# Patient Record
Sex: Male | Born: 1945
Health system: Southern US, Community
[De-identification: ages and names within clinical notes are randomized; demographics above are authoritative.]

## PROBLEM LIST (undated history)

## (undated) DIAGNOSIS — K219 Gastro-esophageal reflux disease without esophagitis: Secondary | ICD-10-CM

## (undated) DIAGNOSIS — I10 Essential (primary) hypertension: Secondary | ICD-10-CM

## (undated) DIAGNOSIS — E7800 Pure hypercholesterolemia, unspecified: Secondary | ICD-10-CM

## (undated) DIAGNOSIS — M1990 Unspecified osteoarthritis, unspecified site: Secondary | ICD-10-CM

## (undated) DIAGNOSIS — I2510 Atherosclerotic heart disease of native coronary artery without angina pectoris: Secondary | ICD-10-CM

## (undated) DIAGNOSIS — B029 Zoster without complications: Secondary | ICD-10-CM

## (undated) DIAGNOSIS — I999 Unspecified disorder of circulatory system: Secondary | ICD-10-CM

## (undated) DIAGNOSIS — M4310 Spondylolisthesis, site unspecified: Secondary | ICD-10-CM

## (undated) HISTORY — DX: Spondylolisthesis, site unspecified: M43.10

## (undated) HISTORY — PX: FOOT SURGERY: SHX648

## (undated) HISTORY — DX: Zoster without complications: B02.9

## (undated) HISTORY — PX: CARDIAC CATHETERIZATION: SHX172

## (undated) HISTORY — DX: Unspecified disorder of circulatory system: I99.9

## (undated) HISTORY — PX: WISDOM TOOTH EXTRACTION: SHX21

## (undated) HISTORY — DX: Unspecified osteoarthritis, unspecified site: M19.90

## (undated) HISTORY — DX: Gastro-esophageal reflux disease without esophagitis: K21.9

## (undated) HISTORY — DX: Atherosclerotic heart disease of native coronary artery without angina pectoris: I25.10

---

## 1998-03-15 ENCOUNTER — Ambulatory Visit (HOSPITAL_COMMUNITY): Admission: EM | Admit: 1998-03-15 | Discharge: 1998-03-16 | Payer: Self-pay | Admitting: Emergency Medicine

## 1998-03-31 ENCOUNTER — Encounter: Payer: Self-pay | Admitting: Gastroenterology

## 1998-03-31 ENCOUNTER — Ambulatory Visit (HOSPITAL_COMMUNITY): Admission: RE | Admit: 1998-03-31 | Discharge: 1998-03-31 | Payer: Self-pay | Admitting: Gastroenterology

## 2000-11-27 ENCOUNTER — Ambulatory Visit (HOSPITAL_COMMUNITY): Admission: RE | Admit: 2000-11-27 | Discharge: 2000-11-27 | Payer: Self-pay | Admitting: Gastroenterology

## 2000-11-27 ENCOUNTER — Encounter (INDEPENDENT_AMBULATORY_CARE_PROVIDER_SITE_OTHER): Payer: Self-pay

## 2008-04-10 ENCOUNTER — Inpatient Hospital Stay (HOSPITAL_COMMUNITY): Admission: EM | Admit: 2008-04-10 | Discharge: 2008-04-11 | Payer: Self-pay | Admitting: Interventional Cardiology

## 2008-04-10 ENCOUNTER — Ambulatory Visit: Payer: Self-pay | Admitting: Diagnostic Radiology

## 2008-04-17 ENCOUNTER — Ambulatory Visit (HOSPITAL_COMMUNITY): Admission: RE | Admit: 2008-04-17 | Discharge: 2008-04-18 | Payer: Self-pay | Admitting: Interventional Cardiology

## 2008-05-09 ENCOUNTER — Encounter (HOSPITAL_COMMUNITY): Admission: RE | Admit: 2008-05-09 | Discharge: 2008-08-07 | Payer: Self-pay | Admitting: Interventional Cardiology

## 2008-08-08 ENCOUNTER — Encounter (HOSPITAL_COMMUNITY): Admission: RE | Admit: 2008-08-08 | Discharge: 2008-09-06 | Payer: Self-pay | Admitting: Interventional Cardiology

## 2009-01-16 ENCOUNTER — Encounter: Admission: RE | Admit: 2009-01-16 | Discharge: 2009-01-16 | Payer: Self-pay | Admitting: Family Medicine

## 2010-05-30 ENCOUNTER — Encounter: Payer: Self-pay | Admitting: Emergency Medicine

## 2010-05-31 ENCOUNTER — Encounter: Payer: Self-pay | Admitting: Family Medicine

## 2010-09-21 NOTE — Discharge Summary (Signed)
NAME:  Chad Hopkins, Chad Hopkins NO.:  [id number]   MEDICAL RECORD NO.:  [id number]          PATIENT TYPE:  INP   LOCATION:  2916                         FACILITY:  [facility]   PHYSICIAN:  Lyn Records, M.D.   DATE OF BIRTH:  09-Feb-1946   DATE OF ADMISSION:  04/10/2008  DATE OF DISCHARGE:  04/11/2008                               DISCHARGE SUMMARY   DISCHARGE DIAGNOSES:  1. Non-ST-segment elevated myocardial infarction, status post drug-      eluting stent to the right coronary artery.  2. Residual coronary artery disease of the circumflex, percutaneous      coronary intervention in the near future.  3. Hypertension.  4. Dyslipidemia.  5. Family history of coronary artery disease.   HOSPITAL COURSE:  Chad Hopkins is a 65 year old male patient with no known  history of coronary artery disease, who began having substernal chest  pain the night before admission.  This went away, but came back on the  date of the admission.  He went to [facility], and he was  found to have a new right bundle-branch block.  There was a question of  minimal ST-segment elevation in the inferior leads with ST-segment  depression in aVL.  The patient initially had chest pain, was placed on  IV nitroglycerin, and was pain free by the time he arrived to the cath  lab.   During the catheterization, the patient was found to have a subtotal  right coronary artery, and a drug-eluting stent was implanted to this  area.  He was also found to have a 90% mid circumflex lesion.  The  culprit lesion was felt to be at the right coronary artery, a drug-  eluting stent implanted.  Our next step is to have a stage procedure and  intervene on the circumflex sometime next week.   His maximum troponin was 2.23.  Sodium 133, BUN 19, and creatinine 1.1.  White count of 14.7, reactive; hemoglobin 14.3; hematocrit 42.7; and  platelets 169.   The patient is being discharged today pain-free, stable, and in  improved  condition.   DISCHARGE MEDICATIONS:  1. Plavix 75 mg daily.  2. Enteric-coated aspirin 325 mg daily.  3. Lisinopril 5 mg daily.  4. Lopressor 12.5 mg twice a day.  5. Pravastatin 80 mg daily.  6. Zantac as prior to admission.  7. Sublingual nitroglycerin p.r.n., chest pain.  8. Fish oil 1000 mg 2 tablets daily.   We did try to talk the patient into taking another statin/Crestor, but  he refused taking that.  He felt safer on pravastatin, as he had done  research.   He is to remain on a low-sodium heart-healthy diet.  Increase activity  slowly as per cardiac rehabilitation.  No driving for 2 days.  No  lifting over 10 pounds for 1 week.  Clean cath site gently with soap and  water, no scrubbing.  The office will call him with his appointment for  his cardioversion.  At that time, we will have a CMET and a lipid panel  drawn to look at his  lipid status.      Guy Franco, P.A.      Lyn Records, M.D.  Electronically Signed    LB/MEDQ  D:  04/11/2008  T:  04/12/2008  Job:  [id number]   cc:   Caryn Bee L. Little, M.D.

## 2010-09-21 NOTE — Cardiovascular Report (Signed)
NAME:  Chad Hopkins, OLIVERA NO.:  [id number]   MEDICAL RECORD NO.:  [id number]          PATIENT TYPE:  OIB   LOCATION:  6527                         FACILITY:  [facility]   PHYSICIAN:  Lyn Records, M.D.   DATE OF BIRTH:  April 01, 1946   DATE OF PROCEDURE:  04/17/2008  DATE OF DISCHARGE:                            CARDIAC CATHETERIZATION   INDICATION:  Residual high-grade mid circumflex disease identified at  the time of PCI of acute right coronary lesion 1 week ago.   PROCEDURES PERFORMED:  1. Left heart catheterization.  2. Selective coronary angiography.  3. Circumflex percutaneous coronary intervention with drug-eluting      stent overlapping stents in the mid circumflex.   DESCRIPTION:  After informed consent, a 6-French sheath was placed.  A  diagnostic right coronary catheter was used to document patency in the  right coronary.  We then performed angiography of the left coronary  selectively engaging the LAD and circumflex sequentially.   After documenting that it was safe to proceed, we performed PCI on the  circumflex.  The patient received aliquots of Versed and fentanyl 12.5  mcg respectively x3.  We used a [place] guidewire to cross the  stenosis.  Ultimate guide catheter was a 6-French #4 CLS catheter.  We  then performed angioplasty using a 12 mm long x 2.5 mm apex.  We then  deployed a 28 mm long x 3.0 mm diameter XIENCE V stent to 12  atmospheres.  We postdilated this region with a 3.25 x 20 mm long  Voyager [place].  We then also performed postdilatation with a 3.5 x 20 [place]  Voyager in the proximal two thirds of the stent to 15 atmospheres.  Postangio images demonstrated a distal margin dissection.  We did  overlapping of this distal stent margin with a 2.75 x 8 mm long XIENCE V  stent to 14 atmospheres.  High-pressure postdilatation with a separate  catheter was not performed.  Two balloon inflations were performed.  Final result was excellent with TIMI  grade 3 flow noted.   Angio-Seal was used for hemostasis.   CONCLUSION:  1. Widely patent right coronary artery stent.  2. Successful overlapping stents in the mid circumflex with reduction      in 80-85% stenosis to 0% with TIMI grade 3 flow.   PLAN:  Continue Plavix for at least 2 years.  Clinical followup, risk  factor modification, hopefully discharge, April 18, 2008.      Lyn Records, M.D.  Electronically Signed     HWS/MEDQ  D:  04/17/2008  T:  04/17/2008  Job:  [id number]   cc:   Caryn Bee L. Little, M.D.

## 2010-09-21 NOTE — Cardiovascular Report (Signed)
NAME:  Chad Hopkins, Chad Hopkins NO.:  [id number]   MEDICAL RECORD NO.:  [id number]          PATIENT TYPE:  INP   LOCATION:  2916                         FACILITY:  [facility]   PHYSICIAN:  Lyn Records, M.D.   DATE OF BIRTH:  04-Aug-1945   DATE OF PROCEDURE:  04/10/2008  DATE OF DISCHARGE:                            CARDIAC CATHETERIZATION   INDICATION:  Acute coronary syndrome, developing within the last 24  hours.   PROCEDURES PERFORMED:  1. Left heart catheterization.  2. Selective coronary angiography.  3. Left ventriculography.  4. Drug-eluting stent implantation in the mid right coronary.   DESCRIPTION:  The patient was brought straight to the cath lab from the  [facility] in [facility].  He was having chest pain  when he presented but upon loading into the ambulance and during the  transport, the discomfort resolved.  This is identical to what happened  the previous evening.  Each episode of discomfort lasted approximately 1  hour.  He is pain-free at the time of arrival in the cath lab.  His EKG  suggests acute injury involving the inferior wall with prominent  hyperacute T waves inferiorly.   After discussing his clinical situation and describing the procedure and  attendant risks, we proceeded with coronary angiography.   A 6-French sheath was placed in the right femoral artery following local  anesthesia with 1% Xylocaine.  1 mg of Versed and 25 mcg of fentanyl  were used initially for sedation.  A 6-French A2 multipurpose catheter  was used for hemodynamic recordings and right coronary angiography.  We  also did a hand injection into the left ventricle.  A #4 left Judkins  catheter was used for left coronary angiography.  After reviewing the  digital images, it was felt that the right coronary was a culprit lesion  for the patient's symptoms over the past 24 hours, and we decided to  perform PCI.   A 6-French Judkins right guide catheter  was used.  The patient was given  a bolus followed by an infusion of Angiomax (1.75 mcg/kg per hour  infusion preceded by 0.75 mcg/kg IV bolus).  We used an [employer]  guidewire to cross the lesion in the right coronary.  Predilatation was  performed with 2.5 Apex balloon.  We then positioned and deployed an 18  mm long x 3.0 mm Promus stent to 12 atmospheres.  Two balloon inflations  were performed.  We then placed and inflated a 15 mm long x 3.5 mm  Quantum balloon.  A single balloon inflation to 14 atmospheres was  performed.  Followup angiography demonstrated an adequate result.  The  case was terminated.  Manual compression was used for hemostasis.  No  complications occurred.   RESULTS:  1. Hemodynamic data:      a.     Aortic pressure 111/56.      b.     Left ventricular pressure 116/8.  2. Left ventriculography:  LV cavity size and function are normal.  EF      was 60%.  3. Coronary angiography:  There is calcification in the LAD and left      main.      a.     Left main coronary:  Widely patent but probably a double       ostial origin of the left the coronary vessels were very short       left main.      b.     Left anterior descending coronary:  The LAD is moderately       diseased.  Calcification is noted.  Segmental 50% obstruction is       noted in the proximal vessel.  The first diagonal contains 80-90%       focal proximal obstruction.  The ostium of the second diagonal       contains a 60% stenosis.  The LAD wraps around the left       ventricular apex.      c.     Circumflex artery:  The circumflex coronary artery contains       a mid vessel segmental 85-90% obstruction.  One obtuse marginal       arises distally supplying the inferolateral wall.      d.     Right coronary:  The right coronary is heavily diseased.       The entire mid segment is diseased.  The distal portion of the mid       segment contains a segmental 99% obstruction with TIMI grade 2        flow noted beyond this point.  A large PDA and 2 left ventricular       branches arise from the distal right coronary.  4. PCI:  The mid right coronary 99% lesion was reduced to 0% with TIMI      grade 3 flow after focal stenting of the distal most portion of the      segmental disease in the mid right coronary.  TIMI grade 3 flow was      reestablished.   CONCLUSIONS:  1. Acute coronary syndrome secondary to plaque rupture and high-grade      stenosis in the mid right coronary.  2. Successful drug-eluting stent implantation in the distal portion of      the mid right coronary from 99%-0% with restoration of TIMI grade 3      flow.  3. Moderate mid right coronary artery disease with 50-70% stenosis      noted in the proximal portion of the midvessel.  The circumflex      contains an 85-90% segmental mid vessel stenosis, the proximal left      anterior descending contains 50% stenosis.  The first diagonal was      90% obstructed.  The second diagonal contains 60% obstruction.  4. Normal left ventricular function.   PLAN:  1. Aspirin and Plavix for greater than 1 year.  2. Consider stenting of the circumflex in the next 7-10 days.  We will      probably treat the first diagonal medically, although this will be      dependent upon the patient's clinical course.      Lyn Records, M.D.  Electronically Signed     HWS/MEDQ  D:  04/10/2008  T:  04/11/2008  Job:  [id number]   cc:   Caryn Bee L. Little, M.D.

## 2010-09-21 NOTE — H&P (Signed)
NAME:  Chad Hopkins, Chad Hopkins NO.:  [id number]   MEDICAL RECORD NO.:  [id number]          PATIENT TYPE:  OIB   LOCATION:  2899                         FACILITY:  [facility]   PHYSICIAN:  Lyn Records, M.D.   DATE OF BIRTH:  1945/08/30   DATE OF ADMISSION:  04/10/2008  DATE OF DISCHARGE:                              HISTORY & PHYSICAL   CHIEF COMPLAINT:  Chest pain.   HISTORY OF PRESENT ILLNESS:  Chad Hopkins is a 65 year old male patient  with no known coronary artery disease who began having substernal chest  pain last night.  It spontaneously went away.  The pain came back around  8 a.m. today and he called his primary care physician's office, who  recommended that he proceed on to the emergency room.  He went to the  outlying [place] [facility] and was found to have a new  right bundle branch block with minimal ST segment elevation in the  inferior leads with ST depression in aVL.  The patient was started on IV  nitroglycerin and, by the time he was transferred to [facility] to  the cath lab, he was pain free.   PAST MEDICAL HISTORY:  Hypertension, hyperlipidemia.   FAMILY HISTORY:  Coronary artery disease.   SOCIAL HISTORY:  Lives in [place] with his wife.  Denies any tobacco,  alcohol, or illicit drug use.   FAMILY HISTORY:  Mom had coronary artery disease.   ALLERGIES:  No known drug allergies.   MEDICATIONS:  Lisinopril, Zantac, and pravastatin.   REVIEW OF SYSTEMS:  Chest pain, as above.  Otherwise, negative.   PHYSICAL EXAM:  VITAL SIGNS:  Pulse 68, respirations 12, blood pressure  136/86, O2 saturation 100% on 2L.  GENERAL:  He is in no acute distress.  HEENT:  Grossly normal.  No carotid or subclavian bruits.  No JVD.  No  thyromegaly.  Sclerae clear.  Conjunctivae normal.  Nares without  drainage.  CHEST:  Clear to auscultation bilaterally.  No wheeze, rales, or  rhonchi.  HEART:  Regular rate and rhythm.  No murmur.  ABDOMEN:  No  hepatomegaly.  Abdomen nontender, nondistended.  No mass.  No bruits.  EXTREMITIES:  No peripheral edema.  No femoral bruit.  Lower extremity,  no lower extremity edema.  Possible DP pulses bilaterally.  NEUROLOGIC:  Cranial nerves 2-12 grossly intact.  Normal mood and  affect.   DIAGNOSTIC STUDIES:  Chest x-ray shows no active disease.  Lab studies  show a hemoglobin of 15.8 and hematocrit 47.7, white count 12.9,  platelets 196,000.  PT 12, INR 0.9, point of care markers negative x1.   EKG shows new right bundle branch block with minimal ST segment  elevation in the inferior leads with peaked T waves in the inferior  leads.  There is ST segment depression in the aVL.   ASSESSMENT AND PLAN:  1. Acute coronary syndrome.  2. Hyperlipidemia.  3. Hypertension.  4. Family history of coronary artery disease.   The patient has been seen and examined by Dr. Verdis Prime.  At this  time, with  the patient's acute coronary syndrome and unstable symptoms,  will proceed with emergent cardiac catheterization.      Guy Franco, P.A.      Lyn Records, M.D.  Electronically Signed    LB/MEDQ  D:  04/10/2008  T:  04/10/2008  Job:  [id number]   cc:   Caryn Bee L. Little, M.D.

## 2010-09-24 NOTE — Procedures (Signed)
[facility]  Patient:    Chad Hopkins, Chad Hopkins               MRN: [id number] Proc. Date: 11/27/00 Adm. Date:  [id number] Attending:  Rich Brave CC:         Al Decant. Janey Greaser, M.D.   Procedure Report  PROCEDURE:  Colonoscopy with polypectomy.  INDICATION:  A 65 year old gentleman for colon cancer screening.  FINDINGS:  Small 3-4 mm polyp, snared in the mid colon or left colon. Scattered rare diverticulosis.  DESCRIPTION OF PROCEDURE:  The patient provided written consent for the procedure.  Sedation was fentanyl 75 mcg and Versed 9 mg IV without arrhythmias or desaturation.  Digital exam of the prostate was unremarkable, and Olympus adjustable-tension pediatric video colonoscope was advanced without significant difficulty to the cecum, as identified by visualization of the appendiceal orifice and the absence of further lumen.  Some external abdominal compression helped to control looping.  Pullback was then performed.  The quality of the prep was very good, and it is felt that all areas were adequately seen.  There was a 3-4 mm semipedunculated polyp removed by snare technique from what I believe was the left colon, possibly the transverse colon.  It was retrieved by suctioning through the scope.  There was a rare diverticulum in the transverse colon.  No large polyps, cancer, colitis, vascular malformations, or extensive diverticular were noted.  Retroflexion in the rectum was unremarkable.  Pull-out through the anal canal demonstrated perhaps some small internal hemorrhoids, nothing impressive.  The patient tolerated this procedure well, and there were no apparent complications.  IMPRESSION: 1. Diminutive colonic polyp. 2. Minimal diverticulosis.  PLAN:  Await pathology on the polyp.  Possible follow-up colonoscopy in five years if the polyp is adenomatous in character. DD:  11/27/00 TD:  11/25/00 Job:  [id number]

## 2011-02-11 LAB — CBC
HCT: 47.7 % (ref 39.0–52.0)
Hemoglobin: 14.3 g/dL (ref 13.0–17.0)
Hemoglobin: 15.8 g/dL (ref 13.0–17.0)
MCHC: 33.3 g/dL (ref 30.0–36.0)
MCHC: 33.4 g/dL (ref 30.0–36.0)
MCV: 91.9 fL (ref 78.0–100.0)
MCV: 93.6 fL (ref 78.0–100.0)
Platelets: 162 10*3/uL (ref 150–400)
Platelets: 196 10*3/uL (ref 150–400)
RBC: 4.53 MIL/uL (ref 4.22–5.81)
RBC: 4.56 MIL/uL (ref 4.22–5.81)
RBC: 5.18 MIL/uL (ref 4.22–5.81)
RDW: 13.1 % (ref 11.5–15.5)
WBC: 12.9 10*3/uL — ABNORMAL HIGH (ref 4.0–10.5)
WBC: 14.7 10*3/uL — ABNORMAL HIGH (ref 4.0–10.5)
WBC: 9.4 10*3/uL (ref 4.0–10.5)

## 2011-02-11 LAB — DIFFERENTIAL
Basophils Absolute: 0.1 10*3/uL (ref 0.0–0.1)
Basophils Relative: 1 % (ref 0–1)
Eosinophils Absolute: 0.3 10*3/uL (ref 0.0–0.7)
Eosinophils Relative: 3 % (ref 0–5)
Lymphocytes Relative: 45 % (ref 12–46)
Lymphs Abs: 5.9 10*3/uL — ABNORMAL HIGH (ref 0.7–4.0)
Monocytes Absolute: 0.7 10*3/uL (ref 0.1–1.0)
Monocytes Relative: 5 % (ref 3–12)
Neutro Abs: 5.9 10*3/uL (ref 1.7–7.7)
Neutrophils Relative %: 46 % (ref 43–77)

## 2011-02-11 LAB — PROTIME-INR
INR: 0.9 (ref 0.00–1.49)
Prothrombin Time: 12 seconds (ref 11.6–15.2)

## 2011-02-11 LAB — CK TOTAL AND CKMB (NOT AT ARMC)
Relative Index: 7.8 — ABNORMAL HIGH (ref 0.0–2.5)
Total CK: 125 U/L (ref 7–232)

## 2011-02-11 LAB — POCT I-STAT, CHEM 8
Calcium, Ion: 1.27 mmol/L (ref 1.12–1.32)
Creatinine, Ser: 1.1 mg/dL (ref 0.4–1.5)
Glucose, Bld: 107 mg/dL — ABNORMAL HIGH (ref 70–99)
Hemoglobin: 14.3 g/dL (ref 13.0–17.0)
Sodium: 133 mEq/L — ABNORMAL LOW (ref 135–145)
TCO2: 24 mmol/L (ref 0–100)

## 2011-02-11 LAB — POCT CARDIAC MARKERS: Myoglobin, poc: 70.4 ng/mL (ref 12–200)

## 2011-02-11 LAB — BASIC METABOLIC PANEL
BUN: 14 mg/dL (ref 6–23)
CO2: 29 mEq/L (ref 19–32)
Calcium: 8.8 mg/dL (ref 8.4–10.5)
Chloride: 102 mEq/L (ref 96–112)
Creatinine, Ser: 1.09 mg/dL (ref 0.4–1.5)
Creatinine, Ser: 1.18 mg/dL (ref 0.4–1.5)
GFR calc Af Amer: >60 mL/min (ref >60)
GFR calc non Af Amer: >60 mL/min (ref >60)
Glucose, Bld: 111 mg/dL — ABNORMAL HIGH (ref 70–99)
Potassium: 3.9 mEq/L (ref 3.5–5.1)

## 2011-02-11 LAB — APTT: aPTT: 25 seconds (ref 24–37)

## 2011-02-11 LAB — TROPONIN I: Troponin I: 1.77 ng/mL (ref 0.00–0.06)

## 2011-05-20 ENCOUNTER — Encounter (HOSPITAL_COMMUNITY): Payer: Self-pay | Admitting: *Deleted

## 2011-05-20 ENCOUNTER — Emergency Department (HOSPITAL_COMMUNITY)
Admission: EM | Admit: 2011-05-20 | Discharge: 2011-05-20 | Disposition: A | Discharge status: Home / Self Care | Payer: Medicare Other | Attending: Emergency Medicine | Admitting: Emergency Medicine

## 2011-05-20 DIAGNOSIS — I10 Essential (primary) hypertension: Secondary | ICD-10-CM | POA: Diagnosis not present

## 2011-05-20 DIAGNOSIS — Z79899 Other long term (current) drug therapy: Secondary | ICD-10-CM | POA: Diagnosis not present

## 2011-05-20 DIAGNOSIS — E789 Disorder of lipoprotein metabolism, unspecified: Secondary | ICD-10-CM | POA: Diagnosis not present

## 2011-05-20 DIAGNOSIS — K5289 Other specified noninfective gastroenteritis and colitis: Secondary | ICD-10-CM | POA: Insufficient documentation

## 2011-05-20 DIAGNOSIS — R112 Nausea with vomiting, unspecified: Secondary | ICD-10-CM | POA: Diagnosis not present

## 2011-05-20 DIAGNOSIS — R252 Cramp and spasm: Secondary | ICD-10-CM | POA: Insufficient documentation

## 2011-05-20 DIAGNOSIS — K529 Noninfective gastroenteritis and colitis, unspecified: Secondary | ICD-10-CM

## 2011-05-20 HISTORY — DX: Pure hypercholesterolemia, unspecified: E78.00

## 2011-05-20 HISTORY — DX: Essential (primary) hypertension: I10

## 2011-05-20 LAB — CBC
HCT: 48.9 % (ref 39.0–52.0)
Hemoglobin: 16.6 g/dL (ref 13.0–17.0)
MCV: 91.7 fL (ref 78.0–100.0)
WBC: 16.6 10*3/uL — ABNORMAL HIGH (ref 4.0–10.5)

## 2011-05-20 LAB — DIFFERENTIAL
Lymphocytes Relative: 4 % — ABNORMAL LOW (ref 12–46)
Lymphs Abs: 0.7 10*3/uL (ref 0.7–4.0)
Monocytes Absolute: 0.5 10*3/uL (ref 0.1–1.0)
Monocytes Relative: 3 % (ref 3–12)
Neutro Abs: 15.4 10*3/uL — ABNORMAL HIGH (ref 1.7–7.7)

## 2011-05-20 LAB — BASIC METABOLIC PANEL
BUN: 25 mg/dL — ABNORMAL HIGH (ref 6–23)
CO2: 25 mEq/L (ref 19–32)
Chloride: 101 mEq/L (ref 96–112)
Creatinine, Ser: 0.92 mg/dL (ref 0.50–1.35)
Glucose, Bld: 150 mg/dL — ABNORMAL HIGH (ref 70–99)

## 2011-05-20 MED ORDER — DIPHENOXYLATE-ATROPINE 2.5-0.025 MG PO TABS: 1.0000 | ORAL_TABLET | Freq: Four times a day (QID) | ORAL | Status: AC | PRN

## 2011-05-20 MED ORDER — ONDANSETRON HCL 4 MG/2ML IJ SOLN
INTRAMUSCULAR | Status: AC
  Administered 2011-05-20: 4 mg via INTRAVENOUS
  Filled 2011-05-20: qty 2

## 2011-05-20 MED ORDER — ONDANSETRON 8 MG PO TBDP: ORAL_TABLET | ORAL | Status: AC

## 2011-05-20 MED ORDER — SODIUM CHLORIDE 0.9 % IV BOLUS (SEPSIS)
1000.0000 mL | Freq: Once | INTRAVENOUS | Status: AC
  Administered 2011-05-20: 1000 mL via INTRAVENOUS

## 2011-05-20 MED ORDER — SODIUM CHLORIDE 0.9 % IV SOLN
INTRAVENOUS | Status: DC
  Administered 2011-05-20: 11:00:00 via INTRAVENOUS

## 2011-05-20 MED ORDER — PANTOPRAZOLE SODIUM 40 MG IV SOLR
40.0000 mg | Freq: Once | INTRAVENOUS | Status: AC
  Administered 2011-05-20: 40 mg via INTRAVENOUS
  Filled 2011-05-20: qty 40

## 2011-05-20 NOTE — ED Notes (Signed)
N/v/d onset last pm

## 2011-05-20 NOTE — ED Notes (Signed)
Discharge instructions reviewed with pt; verbalizes understanding.  No questions asked; no further c/o's voiced.  Pt to lobby via wheelchair.

## 2011-05-20 NOTE — ED Notes (Signed)
Patient states last pm approx. 10pm started having diarrhea approx. 12 am started with nausea and vomiting; States she has had nausea and vomiting off and on  Since. Attempted to drink gatoraid and water however wasn't able to keep it down. Patient is alert and oriented.

## 2011-05-20 NOTE — ED Provider Notes (Addendum)
History     CSN: [id number]  Arrival date & time 05/20/11  3419   First MD Initiated Contact with Patient 05/20/11 1003      Chief Complaint  Patient presents with  . Emesis    (Consider location/radiation/quality/duration/timing/severity/associated sxs/prior treatment) HPI.... nausea vomiting and diarrhea since 10 PM last night. Feels dehydrated. No abdominal pain. Status post cardiac stents x2 in the past. Complains of cramping in legs.  Nothing makes better or worse. Patient unable to drink.  Past Medical History  Diagnosis Date  . Hypertension   . High cholesterol     History reviewed. No pertinent past surgical history.  History reviewed. No pertinent family history.  History  Substance Use Topics  . Smoking status: Never Smoker   . Smokeless tobacco: Not on file  . Alcohol Use: No      Review of Systems  All other systems reviewed and are negative.    Allergies  Review of patient's allergies indicates no known allergies.  Home Medications   Current Outpatient Rx  Name Route Sig Dispense Refill  . OMEGA-3 FATTY ACIDS 1000 MG PO CAPS Oral Take 2 g by mouth 2 (two) times daily.    Marland Kitchen GLUCOSAMINE CHONDR COMPLEX PO Oral Take 1 tablet by mouth daily.    Marland Kitchen LISINOPRIL 5 MG PO TABS Oral Take 5 mg by mouth daily.    . ADULT MULTIVITAMIN W/MINERALS CH Oral Take 1 tablet by mouth daily.    Marland Kitchen OVER THE COUNTER MEDICATION Oral Take 1 tablet by mouth daily. Tumerick    . RESVERATROL PO Oral Take 1 capsule by mouth daily.    Marland Kitchen ROSUVASTATIN CALCIUM 40 MG PO TABS Oral Take 40 mg by mouth daily.    . SAW PALMETTO PO Oral Take 1 tablet by mouth daily.      BP 125/69  Pulse 95  Resp 20  SpO2 99%  Physical Exam  Nursing note and vitals reviewed. Constitutional: He is oriented to person, place, and time. He appears well-developed and well-nourished.       Looks slightly dehydrated.  HENT:  Head: Normocephalic and atraumatic.  Eyes: Conjunctivae and EOM are normal.  Pupils are equal, round, and reactive to light.  Neck: Normal range of motion. Neck supple.  Cardiovascular: Normal rate and regular rhythm.   Pulmonary/Chest: Effort normal and breath sounds normal.  Abdominal: Soft. Bowel sounds are normal.  Musculoskeletal: Normal range of motion.  Neurological: He is alert and oriented to person, place, and time.  Skin: Skin is warm and dry.  Psychiatric: He has a normal mood and affect.    ED Course  Procedures (including critical care time)  Labs Reviewed  CBC - Abnormal; Notable for the following:    WBC 16.6 (*)    All other components within normal limits  DIFFERENTIAL - Abnormal; Notable for the following:    Neutrophils Relative 93 (*)    Neutro Abs 15.4 (*)    Lymphocytes Relative 4 (*)    All other components within normal limits  BASIC METABOLIC PANEL - Abnormal; Notable for the following:    Glucose, Bld 150 (*)    BUN 25 (*)    GFR calc non Af Amer 87 (*)    All other components within normal limits   No results found.   No diagnosis found.    MDM  Will hydrate, check  Electrolytes,  IV Zofran and protonix   Recheck at 1400. Feeling much better. Good color. Wants to  go home.     Donnetta Hutching, MD 05/20/11 1308  Donnetta Hutching, MD 05/20/11 [phone]

## 2011-11-01 DIAGNOSIS — Z09 Encounter for follow-up examination after completed treatment for conditions other than malignant neoplasm: Secondary | ICD-10-CM | POA: Diagnosis not present

## 2011-11-01 DIAGNOSIS — Z86010 Personal history of colonic polyps: Secondary | ICD-10-CM | POA: Diagnosis not present

## 2011-12-22 DIAGNOSIS — H40019 Open angle with borderline findings, low risk, unspecified eye: Secondary | ICD-10-CM | POA: Diagnosis not present

## 2012-01-25 DIAGNOSIS — Z125 Encounter for screening for malignant neoplasm of prostate: Secondary | ICD-10-CM | POA: Diagnosis not present

## 2012-01-25 DIAGNOSIS — I10 Essential (primary) hypertension: Secondary | ICD-10-CM | POA: Diagnosis not present

## 2012-01-30 DIAGNOSIS — K219 Gastro-esophageal reflux disease without esophagitis: Secondary | ICD-10-CM | POA: Diagnosis not present

## 2012-01-30 DIAGNOSIS — Z0000 Encounter for general adult medical examination without abnormal findings: Secondary | ICD-10-CM | POA: Diagnosis not present

## 2012-01-30 DIAGNOSIS — E785 Hyperlipidemia, unspecified: Secondary | ICD-10-CM | POA: Diagnosis not present

## 2012-01-30 DIAGNOSIS — Z1331 Encounter for screening for depression: Secondary | ICD-10-CM | POA: Diagnosis not present

## 2012-01-30 DIAGNOSIS — I2510 Atherosclerotic heart disease of native coronary artery without angina pectoris: Secondary | ICD-10-CM | POA: Diagnosis not present

## 2012-01-30 DIAGNOSIS — Z23 Encounter for immunization: Secondary | ICD-10-CM | POA: Diagnosis not present

## 2012-01-30 DIAGNOSIS — I10 Essential (primary) hypertension: Secondary | ICD-10-CM | POA: Diagnosis not present

## 2012-01-30 DIAGNOSIS — N400 Enlarged prostate without lower urinary tract symptoms: Secondary | ICD-10-CM | POA: Diagnosis not present

## 2012-03-15 DIAGNOSIS — L821 Other seborrheic keratosis: Secondary | ICD-10-CM | POA: Diagnosis not present

## 2012-03-15 DIAGNOSIS — L570 Actinic keratosis: Secondary | ICD-10-CM | POA: Diagnosis not present

## 2012-03-15 DIAGNOSIS — L739 Follicular disorder, unspecified: Secondary | ICD-10-CM | POA: Diagnosis not present

## 2012-03-15 DIAGNOSIS — L820 Inflamed seborrheic keratosis: Secondary | ICD-10-CM | POA: Diagnosis not present

## 2012-03-15 DIAGNOSIS — D485 Neoplasm of uncertain behavior of skin: Secondary | ICD-10-CM | POA: Diagnosis not present

## 2012-03-28 DIAGNOSIS — I10 Essential (primary) hypertension: Secondary | ICD-10-CM | POA: Diagnosis not present

## 2012-03-28 DIAGNOSIS — E785 Hyperlipidemia, unspecified: Secondary | ICD-10-CM | POA: Diagnosis not present

## 2012-03-28 DIAGNOSIS — I2510 Atherosclerotic heart disease of native coronary artery without angina pectoris: Secondary | ICD-10-CM | POA: Diagnosis not present

## 2012-05-08 DIAGNOSIS — J329 Chronic sinusitis, unspecified: Secondary | ICD-10-CM | POA: Diagnosis not present

## 2012-09-26 DIAGNOSIS — Z79899 Other long term (current) drug therapy: Secondary | ICD-10-CM | POA: Diagnosis not present

## 2012-09-26 DIAGNOSIS — E782 Mixed hyperlipidemia: Secondary | ICD-10-CM | POA: Diagnosis not present

## 2012-12-27 DIAGNOSIS — H2510 Age-related nuclear cataract, unspecified eye: Secondary | ICD-10-CM | POA: Diagnosis not present

## 2013-02-04 DIAGNOSIS — Z125 Encounter for screening for malignant neoplasm of prostate: Secondary | ICD-10-CM | POA: Diagnosis not present

## 2013-02-04 DIAGNOSIS — I10 Essential (primary) hypertension: Secondary | ICD-10-CM | POA: Diagnosis not present

## 2013-02-06 DIAGNOSIS — K219 Gastro-esophageal reflux disease without esophagitis: Secondary | ICD-10-CM | POA: Diagnosis not present

## 2013-02-06 DIAGNOSIS — R82998 Other abnormal findings in urine: Secondary | ICD-10-CM | POA: Diagnosis not present

## 2013-02-06 DIAGNOSIS — I2510 Atherosclerotic heart disease of native coronary artery without angina pectoris: Secondary | ICD-10-CM | POA: Diagnosis not present

## 2013-02-06 DIAGNOSIS — N400 Enlarged prostate without lower urinary tract symptoms: Secondary | ICD-10-CM | POA: Diagnosis not present

## 2013-02-06 DIAGNOSIS — E785 Hyperlipidemia, unspecified: Secondary | ICD-10-CM | POA: Diagnosis not present

## 2013-02-06 DIAGNOSIS — Z0000 Encounter for general adult medical examination without abnormal findings: Secondary | ICD-10-CM | POA: Diagnosis not present

## 2013-02-06 DIAGNOSIS — Z1331 Encounter for screening for depression: Secondary | ICD-10-CM | POA: Diagnosis not present

## 2013-02-06 DIAGNOSIS — I10 Essential (primary) hypertension: Secondary | ICD-10-CM | POA: Diagnosis not present

## 2013-03-20 ENCOUNTER — Encounter: Payer: Self-pay | Admitting: Interventional Cardiology

## 2013-03-20 ENCOUNTER — Encounter: Payer: Self-pay | Admitting: *Deleted

## 2013-03-20 DIAGNOSIS — L821 Other seborrheic keratosis: Secondary | ICD-10-CM | POA: Diagnosis not present

## 2013-03-20 DIAGNOSIS — D239 Other benign neoplasm of skin, unspecified: Secondary | ICD-10-CM | POA: Diagnosis not present

## 2013-03-20 DIAGNOSIS — L909 Atrophic disorder of skin, unspecified: Secondary | ICD-10-CM | POA: Diagnosis not present

## 2013-03-20 DIAGNOSIS — D1801 Hemangioma of skin and subcutaneous tissue: Secondary | ICD-10-CM | POA: Diagnosis not present

## 2013-03-20 DIAGNOSIS — L259 Unspecified contact dermatitis, unspecified cause: Secondary | ICD-10-CM | POA: Diagnosis not present

## 2013-03-20 DIAGNOSIS — L570 Actinic keratosis: Secondary | ICD-10-CM | POA: Diagnosis not present

## 2013-03-20 DIAGNOSIS — D2370 Other benign neoplasm of skin of unspecified lower limb, including hip: Secondary | ICD-10-CM | POA: Diagnosis not present

## 2013-03-21 ENCOUNTER — Ambulatory Visit (INDEPENDENT_AMBULATORY_CARE_PROVIDER_SITE_OTHER): Payer: Federal, State, Local not specified - PPO | Admitting: Interventional Cardiology

## 2013-03-21 ENCOUNTER — Encounter: Payer: Self-pay | Admitting: Interventional Cardiology

## 2013-03-21 VITALS — BP 120/65 | HR 63 | Ht 68.0 in | Wt 158.0 lb

## 2013-03-21 DIAGNOSIS — E785 Hyperlipidemia, unspecified: Secondary | ICD-10-CM

## 2013-03-21 DIAGNOSIS — I2510 Atherosclerotic heart disease of native coronary artery without angina pectoris: Secondary | ICD-10-CM | POA: Insufficient documentation

## 2013-03-21 DIAGNOSIS — I10 Essential (primary) hypertension: Secondary | ICD-10-CM

## 2013-03-21 NOTE — Progress Notes (Signed)
Patient ID: Chad Hopkins, male   DOB: 02/27/1946, 67 y.o.   MRN: [id number]    [address] [place], [place]  [address] Phone: [phone] Fax:  [phone]  Date:  03/21/2013   ID:  Chad Hopkins, DOB March 11, 1946, MRN [id number]  PCP:  No primary provider on file.   ASSESSMENT:  1. Coronary atherosclerosis with prior non-ST elevation microinfarction 2009. Asymptomatic 2. Hypertension 3. Hyperlipidemia  PLAN:  1. Continue the current medical regimen and the active lifestyle 2. Clinical followup in one year 3. No change in medical regimen   SUBJECTIVE: Chad Hopkins is a 67 y.o. male who has an active lifestyle and no cardiac symptoms. No medication side effects. His lipids are followed by primary care and have been a target. Blood pressures have been under control. He notices no limitations in his typical activities. He has not needed sublingual nitroglycerin.   Wt Readings from Last 3 Encounters:  03/21/13 158 lb (71.668 kg)     Past Medical History  Diagnosis Date  . Hypertension   . High cholesterol   . HTN (hypertension)   . Osteoarthrosis, unspecified whether generalized or localized, other specified sites   . GERD (gastroesophageal reflux disease)   . Peripheral vascular complications   . CAD (coronary artery disease)     hospitalized December 2009, had non-ST segment myocardial infarction seen by cardiologist Dr. Katrinka Blazing, has drug eluting stent in the right coronary artery and circumflex artery  . Spondylolisthesis     Current Outpatient Prescriptions  Medication Sig Dispense Refill  . fish oil-omega-3 fatty acids 1000 MG capsule Take 1 g by mouth 2 (two) times daily.       . Glucosamine-Chondroitin (GLUCOSAMINE CHONDR COMPLEX PO) Take 1 tablet by mouth daily.      Marland Kitchen lisinopril (PRINIVIL,ZESTRIL) 5 MG tablet Take 5 mg by mouth daily.      . Multiple Vitamin (MULITIVITAMIN WITH MINERALS) TABS Take 1 tablet by mouth daily.      Marland Kitchen OVER THE  COUNTER MEDICATION Take 1 tablet by mouth daily. Tumerick      . RESVERATROL PO Take 1 capsule by mouth daily.      . rosuvastatin (CRESTOR) 40 MG tablet Take 20 mg by mouth daily.       . Saw Palmetto, Serenoa repens, (SAW PALMETTO PO) Take 1 tablet by mouth daily.      Marland Kitchen NITROSTAT 0.4 MG SL tablet Place 1 tablet under the tongue as needed.       No current facility-administered medications for this visit.    Allergies:   No Known Allergies  Social History:  The patient  reports that he has never smoked. He does not have any smokeless tobacco history on file. He reports that he does not drink alcohol or use illicit drugs.   ROS:  Please see the history of present illness.   All other systems reviewed and negative.   OBJECTIVE: VS:  BP 120/65  Pulse 63  Ht 5\' 8"  (1.727 m)  Wt 158 lb (71.668 kg)  BMI 24.03 kg/m2 Well nourished, well developed, in no acute distress HEENT: normal Neck: JVD flat. Carotid bruit 2+  Cardiac:  normal S1, S2; RRR; no murmur Lungs:  clear to auscultation bilaterally, no wheezing, rhonchi or rales Abd: soft, nontender, no hepatomegaly Ext: Edema absent. Pulses 2+ Skin: warm and dry Neuro:  CNs 2-12 intact, no focal abnormalities noted  EKG:  Normal sinus rhythm, right bundle branch  block, leftward axis.       Signed, Darci Needle III, MD 03/21/2013 10:03 AM

## 2013-03-21 NOTE — Patient Instructions (Signed)
Your physician recommends that you continue on your current medications as directed. Please refer to the Current Medication list given to you today.  Your physician wants you to follow-up in: 1 year. You will receive a reminder letter in the mail two months in advance. If you don't receive a letter, please call our office to schedule the follow-up appointment.  

## 2013-03-29 ENCOUNTER — Telehealth: Payer: Self-pay | Admitting: Interventional Cardiology

## 2013-03-29 NOTE — Telephone Encounter (Signed)
New Problem  Pt is currently taking 20 mg of CRESTOR// insurance is going up on the price// 40 mg cost the same as the 20mg  and he asks if he can get the 40 mg and cut it in half// please assist//

## 2013-04-02 NOTE — Telephone Encounter (Signed)
lmom. if Rx for crestor  is written for 40mg  1/2 tab only 15 tablets could be dispensed for the month.pt  is to call the office he has any additional questions.

## 2013-04-26 ENCOUNTER — Other Ambulatory Visit: Payer: Self-pay | Admitting: *Deleted

## 2013-04-26 ENCOUNTER — Telehealth: Payer: Self-pay | Admitting: Interventional Cardiology

## 2013-04-26 MED ORDER — ROSUVASTATIN CALCIUM 40 MG PO TABS: 20.0000 mg | ORAL_TABLET | Freq: Every day | ORAL | Status: DC

## 2013-04-26 NOTE — Telephone Encounter (Signed)
New problem     pt needs CRESTOR  To [facility] on [address]  Called in please.  Pt mailed a letter requesting this two weeks ago.

## 2013-04-26 NOTE — Telephone Encounter (Signed)
Pt is aware of the Crestor 40 mg sent to [facility].

## 2013-05-08 ENCOUNTER — Other Ambulatory Visit: Payer: Self-pay

## 2013-05-08 MED ORDER — LISINOPRIL 5 MG PO TABS: 5.0000 mg | ORAL_TABLET | Freq: Every day | ORAL | Status: DC

## 2013-07-03 DIAGNOSIS — R05 Cough: Secondary | ICD-10-CM | POA: Diagnosis not present

## 2013-07-03 DIAGNOSIS — R059 Cough, unspecified: Secondary | ICD-10-CM | POA: Diagnosis not present

## 2013-09-09 DIAGNOSIS — L821 Other seborrheic keratosis: Secondary | ICD-10-CM | POA: Diagnosis not present

## 2013-09-09 DIAGNOSIS — L820 Inflamed seborrheic keratosis: Secondary | ICD-10-CM | POA: Diagnosis not present

## 2013-09-09 DIAGNOSIS — L909 Atrophic disorder of skin, unspecified: Secondary | ICD-10-CM | POA: Diagnosis not present

## 2013-09-09 DIAGNOSIS — L723 Sebaceous cyst: Secondary | ICD-10-CM | POA: Diagnosis not present

## 2013-09-09 DIAGNOSIS — L919 Hypertrophic disorder of the skin, unspecified: Secondary | ICD-10-CM | POA: Diagnosis not present

## 2013-09-24 ENCOUNTER — Other Ambulatory Visit: Payer: Self-pay | Admitting: *Deleted

## 2013-09-24 ENCOUNTER — Telehealth: Payer: Self-pay | Admitting: Interventional Cardiology

## 2013-09-24 DIAGNOSIS — E782 Mixed hyperlipidemia: Secondary | ICD-10-CM

## 2013-09-24 DIAGNOSIS — Z79899 Other long term (current) drug therapy: Secondary | ICD-10-CM

## 2013-09-24 MED ORDER — LISINOPRIL 5 MG PO TABS: 5.0000 mg | ORAL_TABLET | Freq: Every day | ORAL | Status: DC

## 2013-09-24 NOTE — Telephone Encounter (Signed)
New message     Taking lisinopril 5mg ---30day supply.   Want a 90day supply called [facility] to [facility] at [address]

## 2013-09-26 ENCOUNTER — Other Ambulatory Visit (INDEPENDENT_AMBULATORY_CARE_PROVIDER_SITE_OTHER): Payer: Medicare Other

## 2013-09-26 DIAGNOSIS — Z79899 Other long term (current) drug therapy: Secondary | ICD-10-CM | POA: Diagnosis not present

## 2013-09-26 DIAGNOSIS — E782 Mixed hyperlipidemia: Secondary | ICD-10-CM | POA: Diagnosis not present

## 2013-09-26 LAB — LIPID PANEL
Cholesterol: 126 mg/dL (ref 0–200)
HDL: 47.7 mg/dL (ref >39.00)
LDL Cholesterol: 68 mg/dL (ref 0–99)
TRIGLYCERIDES: 53 mg/dL (ref 0.0–149.0)
Total CHOL/HDL Ratio: 3
VLDL: 10.6 mg/dL (ref 0.0–40.0)

## 2013-09-26 LAB — HEPATIC FUNCTION PANEL
ALBUMIN: 3.7 g/dL (ref 3.5–5.2)
ALK PHOS: 44 U/L (ref 39–117)
ALT: 14 U/L (ref 0–53)
AST: 13 U/L (ref 0–37)
Bilirubin, Direct: 0.2 mg/dL (ref 0.0–0.3)
Total Bilirubin: 1.2 mg/dL (ref 0.2–1.2)
Total Protein: 5.8 g/dL — ABNORMAL LOW (ref 6.0–8.3)

## 2013-10-04 ENCOUNTER — Telehealth: Payer: Self-pay

## 2013-10-04 NOTE — Telephone Encounter (Signed)
Message copied by Lamar Laundry on Fri Oct 04, 2013  1:25 PM ------      Message from: Daneen Schick      Created: Tue Oct 01, 2013  8:12 AM       Lipids are in excellent pattern and at target ------

## 2013-10-04 NOTE — Telephone Encounter (Signed)
called to give pt lab results.lmtcb

## 2013-10-08 ENCOUNTER — Telehealth: Payer: Self-pay | Admitting: Interventional Cardiology

## 2013-10-08 NOTE — Telephone Encounter (Signed)
Patient is returning your call from last week. Please call and advise.

## 2013-10-08 NOTE — Telephone Encounter (Signed)
pt given lab results.Lipids are in excellent pattern and at target.copy of results mailed to pt.pt verbalized understanding.

## 2013-10-08 NOTE — Telephone Encounter (Signed)
Message copied by Lamar Laundry on Tue Oct 08, 2013  1:40 PM ------      Message from: Daneen Schick      Created: Tue Oct 01, 2013  8:12 AM       Lipids are in excellent pattern and at target ------

## 2013-10-31 ENCOUNTER — Encounter: Payer: Self-pay | Admitting: *Deleted

## 2013-11-14 ENCOUNTER — Other Ambulatory Visit: Payer: Self-pay

## 2013-11-14 MED ORDER — NITROGLYCERIN 0.4 MG SL SUBL: 0.4000 mg | SUBLINGUAL_TABLET | SUBLINGUAL | Status: DC | PRN

## 2014-01-06 DIAGNOSIS — Z23 Encounter for immunization: Secondary | ICD-10-CM | POA: Diagnosis not present

## 2014-01-09 DIAGNOSIS — H40019 Open angle with borderline findings, low risk, unspecified eye: Secondary | ICD-10-CM | POA: Diagnosis not present

## 2014-01-09 DIAGNOSIS — H2510 Age-related nuclear cataract, unspecified eye: Secondary | ICD-10-CM | POA: Diagnosis not present

## 2014-03-09 ENCOUNTER — Other Ambulatory Visit: Payer: Self-pay | Admitting: Interventional Cardiology

## 2014-03-10 DIAGNOSIS — E782 Mixed hyperlipidemia: Secondary | ICD-10-CM | POA: Diagnosis not present

## 2014-03-10 DIAGNOSIS — I10 Essential (primary) hypertension: Secondary | ICD-10-CM | POA: Diagnosis not present

## 2014-03-10 DIAGNOSIS — Z125 Encounter for screening for malignant neoplasm of prostate: Secondary | ICD-10-CM | POA: Diagnosis not present

## 2014-03-13 DIAGNOSIS — N400 Enlarged prostate without lower urinary tract symptoms: Secondary | ICD-10-CM | POA: Diagnosis not present

## 2014-03-13 DIAGNOSIS — I10 Essential (primary) hypertension: Secondary | ICD-10-CM | POA: Diagnosis not present

## 2014-03-13 DIAGNOSIS — Z0000 Encounter for general adult medical examination without abnormal findings: Secondary | ICD-10-CM | POA: Diagnosis not present

## 2014-03-13 DIAGNOSIS — I25119 Atherosclerotic heart disease of native coronary artery with unspecified angina pectoris: Secondary | ICD-10-CM | POA: Diagnosis not present

## 2014-03-13 DIAGNOSIS — Z23 Encounter for immunization: Secondary | ICD-10-CM | POA: Diagnosis not present

## 2014-03-13 DIAGNOSIS — K219 Gastro-esophageal reflux disease without esophagitis: Secondary | ICD-10-CM | POA: Diagnosis not present

## 2014-03-13 DIAGNOSIS — R8290 Unspecified abnormal findings in urine: Secondary | ICD-10-CM | POA: Diagnosis not present

## 2014-03-13 DIAGNOSIS — E782 Mixed hyperlipidemia: Secondary | ICD-10-CM | POA: Diagnosis not present

## 2014-03-19 DIAGNOSIS — D2371 Other benign neoplasm of skin of right lower limb, including hip: Secondary | ICD-10-CM | POA: Diagnosis not present

## 2014-03-19 DIAGNOSIS — L821 Other seborrheic keratosis: Secondary | ICD-10-CM | POA: Diagnosis not present

## 2014-03-19 DIAGNOSIS — B351 Tinea unguium: Secondary | ICD-10-CM | POA: Diagnosis not present

## 2014-03-19 DIAGNOSIS — D1801 Hemangioma of skin and subcutaneous tissue: Secondary | ICD-10-CM | POA: Diagnosis not present

## 2014-03-19 DIAGNOSIS — L720 Epidermal cyst: Secondary | ICD-10-CM | POA: Diagnosis not present

## 2014-03-19 DIAGNOSIS — D225 Melanocytic nevi of trunk: Secondary | ICD-10-CM | POA: Diagnosis not present

## 2014-03-21 ENCOUNTER — Ambulatory Visit (INDEPENDENT_AMBULATORY_CARE_PROVIDER_SITE_OTHER): Payer: Medicare Other | Admitting: Interventional Cardiology

## 2014-03-21 ENCOUNTER — Encounter: Payer: Self-pay | Admitting: Interventional Cardiology

## 2014-03-21 VITALS — BP 110/70 | HR 67 | Ht 68.0 in | Wt 157.0 lb

## 2014-03-21 DIAGNOSIS — I10 Essential (primary) hypertension: Secondary | ICD-10-CM | POA: Diagnosis not present

## 2014-03-21 DIAGNOSIS — I4510 Unspecified right bundle-branch block: Secondary | ICD-10-CM | POA: Insufficient documentation

## 2014-03-21 DIAGNOSIS — I2510 Atherosclerotic heart disease of native coronary artery without angina pectoris: Secondary | ICD-10-CM

## 2014-03-21 DIAGNOSIS — I739 Peripheral vascular disease, unspecified: Secondary | ICD-10-CM

## 2014-03-21 DIAGNOSIS — E785 Hyperlipidemia, unspecified: Secondary | ICD-10-CM | POA: Diagnosis not present

## 2014-03-21 MED ORDER — ASPIRIN EC 81 MG PO TBEC: 81.0000 mg | DELAYED_RELEASE_TABLET | Freq: Every day | ORAL | Status: AC

## 2014-03-21 MED ORDER — ROSUVASTATIN CALCIUM 10 MG PO TABS: 10.0000 mg | ORAL_TABLET | Freq: Every day | ORAL | Status: DC

## 2014-03-21 NOTE — Patient Instructions (Addendum)
Your physician has recommended you make the following change in your medication:  1) DECREASE Crestor to 10mg  daily. An Rx has been sent to your pharmacy  Take all other medications as prescribed  Your physician recommends that you return for a FASTING lipid profile and alt: 2 months after starting lower Crestor dosage  Your physician wants you to follow-up in: 1 year You will receive a reminder letter in the mail two months in advance. If you don't receive a letter, please call our office to schedule the follow-up appointment.

## 2014-03-21 NOTE — Progress Notes (Signed)
Patient ID: RONEL RODEHEAVER, male   DOB: [birth year], 68 y.o.   MRN: [id number]    [address], [address] [place], [place]  [address] Phone: [phone] Fax:  [phone]  Date:  03/21/2014   ID:  BIRAN MAYBERRY, DOB 01/09/1946, MRN [id number]  PCP:  Gennette Pac, MD   ASSESSMENT:  1. Coronary atherosclerosis with prior stent 2009 RCA and circumflex, asymptomatic with reference to angina. 2. Essential hypertension excellently controlled 3. Hyperlipidemia, with excellent LDL on 20 mg of Crestor. The patient inquires about further reduction in dose 4. Peripheral arterial disease without claudication  PLAN:  1. Decreased Crestor 10 mg daily as his total cholesterol is less than 150 on 20 mg. We will check a statin panel with a LT 2 months after the decrease in dose. 2. Continue active lifestyle including exercise multiple times per week as he currently does 3. Clinical follow-up in one year   SUBJECTIVE: BIFF RUTIGLIANO is a 68 y.o. male who is doing well. He denies claudication and angina. He has not had palpitations or syncope. He inquires about whether or not he can further reduce the Crestor as he has lost significant weight and is now exercising neither of which was the case when he had his infarction in 2009. He denies claudication, transient neurological complaints, palpitations, and syncope.   Wt Readings from Last 3 Encounters:  03/21/14 157 lb (71.215 kg)  03/21/13 158 lb (71.668 kg)     Past Medical History  Diagnosis Date  . Hypertension   . High cholesterol   . Osteoarthrosis, unspecified whether generalized or localized, other specified sites   . GERD (gastroesophageal reflux disease)   . Peripheral vascular complications   . Spondylolisthesis   . CAD (coronary artery disease)     hospitalized December 2009, had non-ST segment myocardial infarction seen by cardiologist Dr. Tamala Julian, has drug eluting stent in the right coronary artery and circumflex  artery  . Shingles     Current Outpatient Prescriptions  Medication Sig Dispense Refill  . fish oil-omega-3 fatty acids 1000 MG capsule Take 1 g by mouth 2 (two) times daily.     . Glucosamine-Chondroitin (GLUCOSAMINE CHONDR COMPLEX PO) Take 1 tablet by mouth daily.    Marland Kitchen lisinopril (PRINIVIL,ZESTRIL) 5 MG tablet TAKE 1 TABLET BY MOUTH EVERY DAY 90 tablet 0  . Multiple Vitamin (MULITIVITAMIN WITH MINERALS) TABS Take 1 tablet by mouth daily.    . nitroGLYCERIN (NITROSTAT) 0.4 MG SL tablet Place 1 tablet (0.4 mg total) under the tongue as needed. 25 tablet 3  . OVER THE COUNTER MEDICATION Take 1 tablet by mouth daily. [place]    . ranitidine (ZANTAC) 150 MG tablet Take 150 mg by mouth at bedtime.    Marland Kitchen RESVERATROL PO Take 1 capsule by mouth daily.    . rosuvastatin (CRESTOR) 40 MG tablet Take 0.5 tablets (20 mg total) by mouth daily. 30 tablet 11  . Saw Palmetto, Serenoa repens, (SAW PALMETTO PO) Take 1 tablet by mouth daily.     No current facility-administered medications for this visit.    Allergies:   No Known Allergies  Social History:  The patient  reports that he has never smoked. He does not have any smokeless tobacco history on file. He reports that he does not drink alcohol or use illicit drugs.   ROS:  Please see the history of present illness.   Stable weight without nausea, vomiting, abdominal pain, hematocrit TZ, hematemesis, dysphagia, palpitations, syncope,  or other complaints   All other systems reviewed and negative.   OBJECTIVE: VS:  BP 110/70 mmHg  Pulse 67  Ht 5\' 8"  (1.727 m)  Wt 157 lb (71.215 kg)  BMI 23.88 kg/m2 Well nourished, well developed, in no acute distress, slender and appears in the stated age 61: normal Neck: JVD flat. Carotid bruit absent  Cardiac:  normal S1, S2; RRR; no murmur Lungs:  clear to auscultation bilaterally, no wheezing, rhonchi or rales Abd: soft, nontender, no hepatomegaly Ext: Edema absent. Pulses 2+ Skin: warm and dry Neuro:   CNs 2-12 intact, no focal abnormalities noted  EKG:  Both sinus rhythm with right bundle branch block and left atrial abnormality       Signed, Illene Labrador III, MD 03/21/2014 9:14 AM

## 2014-04-04 ENCOUNTER — Emergency Department (HOSPITAL_BASED_OUTPATIENT_CLINIC_OR_DEPARTMENT_OTHER)
Admission: EM | Admit: 2014-04-04 | Discharge: 2014-04-05 | Disposition: A | Discharge status: Home / Self Care | Payer: Federal, State, Local not specified - PPO | Attending: Emergency Medicine | Admitting: Emergency Medicine

## 2014-04-04 ENCOUNTER — Emergency Department (HOSPITAL_BASED_OUTPATIENT_CLINIC_OR_DEPARTMENT_OTHER): Payer: Federal, State, Local not specified - PPO

## 2014-04-04 ENCOUNTER — Encounter (HOSPITAL_BASED_OUTPATIENT_CLINIC_OR_DEPARTMENT_OTHER): Payer: Self-pay | Admitting: Emergency Medicine

## 2014-04-04 DIAGNOSIS — J189 Pneumonia, unspecified organism: Secondary | ICD-10-CM | POA: Diagnosis not present

## 2014-04-04 DIAGNOSIS — I2510 Atherosclerotic heart disease of native coronary artery without angina pectoris: Secondary | ICD-10-CM | POA: Diagnosis not present

## 2014-04-04 DIAGNOSIS — I10 Essential (primary) hypertension: Secondary | ICD-10-CM | POA: Insufficient documentation

## 2014-04-04 DIAGNOSIS — Z8739 Personal history of other diseases of the musculoskeletal system and connective tissue: Secondary | ICD-10-CM | POA: Diagnosis not present

## 2014-04-04 DIAGNOSIS — E780 Pure hypercholesterolemia: Secondary | ICD-10-CM | POA: Insufficient documentation

## 2014-04-04 DIAGNOSIS — Z79899 Other long term (current) drug therapy: Secondary | ICD-10-CM | POA: Insufficient documentation

## 2014-04-04 DIAGNOSIS — R509 Fever, unspecified: Secondary | ICD-10-CM | POA: Diagnosis not present

## 2014-04-04 DIAGNOSIS — K219 Gastro-esophageal reflux disease without esophagitis: Secondary | ICD-10-CM | POA: Insufficient documentation

## 2014-04-04 DIAGNOSIS — J159 Unspecified bacterial pneumonia: Secondary | ICD-10-CM | POA: Diagnosis not present

## 2014-04-04 DIAGNOSIS — Z7982 Long term (current) use of aspirin: Secondary | ICD-10-CM | POA: Diagnosis not present

## 2014-04-04 DIAGNOSIS — R0602 Shortness of breath: Secondary | ICD-10-CM

## 2014-04-04 DIAGNOSIS — R05 Cough: Secondary | ICD-10-CM | POA: Diagnosis not present

## 2014-04-04 DIAGNOSIS — Z8619 Personal history of other infectious and parasitic diseases: Secondary | ICD-10-CM | POA: Insufficient documentation

## 2014-04-04 LAB — CBC WITH DIFFERENTIAL/PLATELET
Basophils Absolute: 0.1 10*3/uL (ref 0.0–0.1)
Basophils Relative: 0 % (ref 0–1)
Eosinophils Absolute: 0.5 10*3/uL (ref 0.0–0.7)
Eosinophils Relative: 4 % (ref 0–5)
HEMATOCRIT: 43.5 % (ref 39.0–52.0)
HEMOGLOBIN: 14.3 g/dL (ref 13.0–17.0)
Lymphocytes Relative: 17 % (ref 12–46)
Lymphs Abs: 1.9 10*3/uL (ref 0.7–4.0)
MCH: 30.8 pg (ref 26.0–34.0)
MCHC: 32.9 g/dL (ref 30.0–36.0)
MCV: 93.8 fL (ref 78.0–100.0)
MONO ABS: 1.2 10*3/uL — AB (ref 0.1–1.0)
MONOS PCT: 10 % (ref 3–12)
NEUTROS ABS: 7.7 10*3/uL (ref 1.7–7.7)
NEUTROS PCT: 69 % (ref 43–77)
Platelets: 134 10*3/uL — ABNORMAL LOW (ref 150–400)
RBC: 4.64 MIL/uL (ref 4.22–5.81)
RDW: 14.3 % (ref 11.5–15.5)
WBC: 11.3 10*3/uL — ABNORMAL HIGH (ref 4.0–10.5)

## 2014-04-04 MED ORDER — ACETAMINOPHEN 325 MG PO TABS
ORAL_TABLET | ORAL | Status: AC
Start: 2014-04-04 — End: 2014-04-04
  Filled 2014-04-04: qty 2

## 2014-04-04 MED ORDER — ACETAMINOPHEN 325 MG PO TABS
650.0000 mg | ORAL_TABLET | Freq: Once | ORAL | Status: AC
  Administered 2014-04-04: 650 mg via ORAL

## 2014-04-04 NOTE — ED Provider Notes (Signed)
CSN: [id number]     Arrival date & time 04/04/14  2044 History   First MD Initiated Contact with Patient 04/04/14 2354     Chief Complaint  Patient presents with  . Fever and Chills      (Consider location/radiation/quality/duration/timing/severity/associated sxs/prior Treatment) HPI  This is a 68 year old male with a two-day history of flulike symptoms. Specifically he has had weakness, general malaise, nasal congestion and cough. The symptoms acutely worsened this afternoon and evening developed a fever as high as 101.5. Symptoms are moderate and improved with acetaminophen administered on arrival. He denies shortness of breath, nausea, vomiting or diarrhea.  Past Medical History  Diagnosis Date  . Hypertension   . High cholesterol   . Osteoarthrosis, unspecified whether generalized or localized, other specified sites   . GERD (gastroesophageal reflux disease)   . Peripheral vascular complications   . Spondylolisthesis   . CAD (coronary artery disease)     hospitalized December 2009, had non-ST segment myocardial infarction seen by cardiologist Dr. Tamala Julian, has drug eluting stent in the right coronary artery and circumflex artery  . Shingles    Past Surgical History  Procedure Laterality Date  . Wisdom tooth extraction    . Foot surgery    . Cardiac catheterization      December of 09 with diagnosis of non-ST segment elevated myocardial infarction with drug eluting stent to the right coronary artery   Family History  Problem Relation Age of Onset  . Lymphoma Father   . Melanoma Father   . CAD Mother   . Cancer - Prostate Brother    History  Substance Use Topics  . Smoking status: Never Smoker   . Smokeless tobacco: Not on file  . Alcohol Use: No    Review of Systems  All other systems reviewed and are negative.   Allergies  Review of patient's allergies indicates no known allergies.  Home Medications   Prior to Admission medications   Medication Sig Start Date  End Date Taking? Authorizing Provider  aspirin EC 81 MG tablet Take 1 tablet (81 mg total) by mouth daily. 03/21/14   Belva Crome III, MD  fish oil-omega-3 fatty acids 1000 MG capsule Take 1 g by mouth 2 (two) times daily.     Historical Provider, MD  Glucosamine-Chondroitin (GLUCOSAMINE CHONDR COMPLEX PO) Take 1 tablet by mouth daily.    Historical Provider, MD  lisinopril (PRINIVIL,ZESTRIL) 5 MG tablet TAKE 1 TABLET BY MOUTH EVERY DAY 03/11/14   Belva Crome III, MD  Multiple Vitamin (MULITIVITAMIN WITH MINERALS) TABS Take 1 tablet by mouth daily.    Historical Provider, MD  nitroGLYCERIN (NITROSTAT) 0.4 MG SL tablet Place 1 tablet (0.4 mg total) under the tongue as needed. 11/14/13   Belva Crome III, MD  OVER THE COUNTER MEDICATION Take 1 tablet by mouth daily. [place] Provider, MD  ranitidine (ZANTAC) 150 MG tablet Take 150 mg by mouth at bedtime.    Historical Provider, MD  RESVERATROL PO Take 1 capsule by mouth daily.    Historical Provider, MD  rosuvastatin (CRESTOR) 10 MG tablet Take 1 tablet (10 mg total) by mouth daily. 03/21/14   Belva Crome III, MD  Saw Palmetto, Serenoa repens, (SAW PALMETTO PO) Take 1 tablet by mouth daily.    Historical Provider, MD   BP 136/82 mmHg  Pulse 100  Temp(Src) 99.1 F (37.3 C) (Oral)  Resp 18  Ht 5\' 8"  (1.727 m)  Wt 154  lb (69.854 kg)  BMI 23.42 kg/m2  SpO2 96%   Physical Exam  General: Well-developed, well-nourished male in no acute distress; appearance consistent with age of record HENT: normocephalic; atraumatic Eyes: pupils equal, round and reactive to light; extraocular muscles intact Neck: supple Heart: regular rate and rhythm Lungs: clear to auscultation bilaterally Abdomen: soft; nondistended; nontender; no masses or hepatosplenomegaly; bowel sounds present Extremities: No deformity; full range of motion; pulses normal Neurologic: Awake, alert and oriented; motor function intact in all extremities and symmetric; no  facial droop Skin: Warm and dry Psychiatric: Normal mood and affect    ED Course  Procedures (including critical care time)   MDM   Nursing notes and vitals signs, including pulse oximetry, reviewed.  Summary of this visit's results, reviewed by myself:  Labs:  Results for orders placed or performed during the hospital encounter of 04/04/14 (from the past 24 hour(s))  CBC with Differential     Status: Abnormal   Collection Time: 04/04/14 11:35 PM  Result Value Ref Range   WBC 11.3 (H) 4.0 - 10.5 K/uL   RBC 4.64 4.22 - 5.81 MIL/uL   Hemoglobin 14.3 13.0 - 17.0 g/dL   HCT 43.5 39.0 - 52.0 %   MCV 93.8 78.0 - 100.0 fL   MCH 30.8 26.0 - 34.0 pg   MCHC 32.9 30.0 - 36.0 g/dL   RDW 14.3 11.5 - 15.5 %   Platelets 134 (L) 150 - 400 K/uL   Neutrophils Relative % 69 43 - 77 %   Neutro Abs 7.7 1.7 - 7.7 K/uL   Lymphocytes Relative 17 12 - 46 %   Lymphs Abs 1.9 0.7 - 4.0 K/uL   Monocytes Relative 10 3 - 12 %   Monocytes Absolute 1.2 (H) 0.1 - 1.0 K/uL   Eosinophils Relative 4 0 - 5 %   Eosinophils Absolute 0.5 0.0 - 0.7 K/uL   Basophils Relative 0 0 - 1 %   Basophils Absolute 0.1 0.0 - 0.1 K/uL  Basic metabolic panel     Status: Abnormal   Collection Time: 04/04/14 11:35 PM  Result Value Ref Range   Sodium 142 137 - 147 mEq/L   Potassium 4.3 3.7 - 5.3 mEq/L   Chloride 103 96 - 112 mEq/L   CO2 27 19 - 32 mEq/L   Glucose, Bld 134 (H) 70 - 99 mg/dL   BUN 15 6 - 23 mg/dL   Creatinine, Ser 1.10 0.50 - 1.35 mg/dL   Calcium 9.0 8.4 - 10.5 mg/dL   GFR calc non Af Amer 67 (L) >90 mL/min   GFR calc Af Amer 78 (L) >90 mL/min   Anion gap 12 5 - 15  Urinalysis, Routine w reflex microscopic     Status: Abnormal   Collection Time: 04/04/14 11:57 PM  Result Value Ref Range   Color, Urine YELLOW YELLOW   APPearance CLEAR CLEAR   Specific Gravity, Urine 1.015 1.005 - 1.030   pH 6.0 5.0 - 8.0   Glucose, UA NEGATIVE NEGATIVE mg/dL   Hgb urine dipstick SMALL (A) NEGATIVE   Bilirubin  Urine NEGATIVE NEGATIVE   Ketones, ur NEGATIVE NEGATIVE mg/dL   Protein, ur NEGATIVE NEGATIVE mg/dL   Urobilinogen, UA 0.2 0.0 - 1.0 mg/dL   Nitrite NEGATIVE NEGATIVE   Leukocytes, UA NEGATIVE NEGATIVE  Urine microscopic-add on     Status: None   Collection Time: 04/04/14 11:57 PM  Result Value Ref Range   Squamous Epithelial / LPF RARE RARE  WBC, UA 0-2 <3 WBC/hpf   RBC / HPF 3-6 <3 RBC/hpf    Imaging Studies: Dg Chest 2 View  04/04/2014   CLINICAL DATA:  Fever, chills, body aches, cough  EXAM: CHEST  2 VIEW  COMPARISON:  07/03/2013  FINDINGS: Cardiomediastinal silhouette is stable. Moderate size hiatal hernia measures 5.7 cm. There is streaky left base retrocardiac atelectasis or early infiltrate. No pulmonary edema.  IMPRESSION: Moderate size hiatal hernia. Streaky left base retrocardiac atelectasis or early infiltrate.   Electronically Signed   By: Lahoma Crocker M.D.   On: 04/04/2014 22:03   Will treat for CAP and influenza.    Wynetta Fines, MD 04/05/14 [phone]

## 2014-04-04 NOTE — ED Notes (Signed)
Fever chills and aches at home. ASA at home

## 2014-04-04 NOTE — ED Notes (Signed)
MD at bedside. 

## 2014-04-05 LAB — URINALYSIS, ROUTINE W REFLEX MICROSCOPIC
Bilirubin Urine: NEGATIVE
GLUCOSE, UA: NEGATIVE mg/dL
Ketones, ur: NEGATIVE mg/dL
LEUKOCYTES UA: NEGATIVE
Nitrite: NEGATIVE
PH: 6 (ref 5.0–8.0)
Protein, ur: NEGATIVE mg/dL
Specific Gravity, Urine: 1.015 (ref 1.005–1.030)
Urobilinogen, UA: 0.2 mg/dL (ref 0.0–1.0)

## 2014-04-05 LAB — BASIC METABOLIC PANEL
ANION GAP: 12 (ref 5–15)
BUN: 15 mg/dL (ref 6–23)
CO2: 27 meq/L (ref 19–32)
CREATININE: 1.1 mg/dL (ref 0.50–1.35)
Calcium: 9 mg/dL (ref 8.4–10.5)
Chloride: 103 mEq/L (ref 96–112)
GFR calc Af Amer: 78 mL/min — ABNORMAL LOW (ref >90)
GFR calc non Af Amer: 67 mL/min — ABNORMAL LOW (ref >90)
Glucose, Bld: 134 mg/dL — ABNORMAL HIGH (ref 70–99)
POTASSIUM: 4.3 meq/L (ref 3.7–5.3)
Sodium: 142 mEq/L (ref 137–147)

## 2014-04-05 LAB — URINE MICROSCOPIC-ADD ON

## 2014-04-05 MED ORDER — CEFTRIAXONE SODIUM 1 G IJ SOLR
INTRAMUSCULAR | Status: AC
  Filled 2014-04-05: qty 10

## 2014-04-05 MED ORDER — OSELTAMIVIR PHOSPHATE 75 MG PO CAPS: 75.0000 mg | ORAL_CAPSULE | Freq: Two times a day (BID) | ORAL | Status: DC

## 2014-04-05 MED ORDER — DEXTROSE 5 % IV SOLN
1.0000 g | Freq: Once | INTRAVENOUS | Status: AC
  Administered 2014-04-05: 1 g via INTRAVENOUS

## 2014-04-05 MED ORDER — CLARITHROMYCIN 500 MG PO TABS: 500.0000 mg | ORAL_TABLET | Freq: Two times a day (BID) | ORAL | Status: DC

## 2014-04-11 DIAGNOSIS — R05 Cough: Secondary | ICD-10-CM | POA: Diagnosis not present

## 2014-04-11 DIAGNOSIS — J189 Pneumonia, unspecified organism: Secondary | ICD-10-CM | POA: Diagnosis not present

## 2014-04-17 ENCOUNTER — Ambulatory Visit
Admission: RE | Admit: 2014-04-17 | Discharge: 2014-04-17 | Disposition: A | Discharge status: Home / Self Care | Payer: Medicare Other | Source: Ambulatory Visit | Attending: Family Medicine | Admitting: Family Medicine

## 2014-04-17 ENCOUNTER — Other Ambulatory Visit: Payer: Self-pay | Admitting: Family Medicine

## 2014-04-17 DIAGNOSIS — Z8701 Personal history of pneumonia (recurrent): Secondary | ICD-10-CM | POA: Diagnosis not present

## 2014-04-17 DIAGNOSIS — Z09 Encounter for follow-up examination after completed treatment for conditions other than malignant neoplasm: Secondary | ICD-10-CM

## 2014-04-17 DIAGNOSIS — R918 Other nonspecific abnormal finding of lung field: Secondary | ICD-10-CM | POA: Diagnosis not present

## 2014-04-18 DIAGNOSIS — J189 Pneumonia, unspecified organism: Secondary | ICD-10-CM | POA: Diagnosis not present

## 2014-05-05 ENCOUNTER — Other Ambulatory Visit: Payer: Self-pay

## 2014-05-05 DIAGNOSIS — E785 Hyperlipidemia, unspecified: Secondary | ICD-10-CM

## 2014-05-05 MED ORDER — ROSUVASTATIN CALCIUM 20 MG PO TABS: 10.0000 mg | ORAL_TABLET | Freq: Every day | ORAL | Status: DC

## 2014-05-17 DIAGNOSIS — J069 Acute upper respiratory infection, unspecified: Secondary | ICD-10-CM | POA: Diagnosis not present

## 2014-06-09 ENCOUNTER — Other Ambulatory Visit: Payer: Self-pay | Admitting: Interventional Cardiology

## 2014-09-26 DIAGNOSIS — H04123 Dry eye syndrome of bilateral lacrimal glands: Secondary | ICD-10-CM | POA: Diagnosis not present

## 2014-10-30 DIAGNOSIS — L720 Epidermal cyst: Secondary | ICD-10-CM | POA: Diagnosis not present

## 2014-11-11 ENCOUNTER — Telehealth: Payer: Self-pay | Admitting: Interventional Cardiology

## 2014-11-11 NOTE — Telephone Encounter (Signed)
New message  Pt called states that he was advised to come in for a lab visit for a cholesterol check per Dr. Tamala Julian. Made the appt. Please put in orders.

## 2014-11-11 NOTE — Telephone Encounter (Signed)
Informed the pt that the orders for Lipids and LFTs were associated with his lab appt set for tomorrow 7/6 at our office.  Informed the pt that he should come fasting for these labs.  Informed the pt of our lab hours.  Pt verbalized understanding.

## 2014-11-12 ENCOUNTER — Other Ambulatory Visit (INDEPENDENT_AMBULATORY_CARE_PROVIDER_SITE_OTHER): Payer: Medicare Other | Admitting: *Deleted

## 2014-11-12 DIAGNOSIS — E785 Hyperlipidemia, unspecified: Secondary | ICD-10-CM

## 2014-11-12 LAB — LIPID PANEL
Cholesterol: 121 mg/dL (ref 0–200)
HDL: 40 mg/dL (ref >39.00)
LDL Cholesterol: 64 mg/dL (ref 0–99)
NonHDL: 81
Total CHOL/HDL Ratio: 3
Triglycerides: 85 mg/dL (ref 0.0–149.0)
VLDL: 17 mg/dL (ref 0.0–40.0)

## 2014-11-12 LAB — ALT: ALT: 12 U/L (ref 0–53)

## 2014-11-19 ENCOUNTER — Telehealth: Payer: Self-pay | Admitting: Interventional Cardiology

## 2014-11-19 DIAGNOSIS — E785 Hyperlipidemia, unspecified: Secondary | ICD-10-CM

## 2014-11-19 NOTE — Telephone Encounter (Signed)
New Message ° ° ° ° ° ° °Pt calling to get lab results. Please call back and advise. °

## 2014-11-20 NOTE — Telephone Encounter (Signed)
Returned pt call Pt given prelim lab results (lipid,alt) Adv pt that results have not yet been reviewed by Dr.Smith. Dr.Smith is currently out of the office. Pt rqst a written copy of results. Adv pt once labs have been reviewed by Dr.Smith, I will mail him a copy of his results and Dr.Smith's recommendations. Pt was dispensed generic rosuvastatin and would like to know if Dr.Smith thinks it is as effective as the brand name. Message fwd to Dr.Smith

## 2014-11-30 NOTE — Telephone Encounter (Signed)
Labs are better than last year. Generic therapy is fine.  Recheck fasting lipid and liver in 6 months.

## 2014-12-01 NOTE — Telephone Encounter (Signed)
Labs results and Dr.Smith 's recommendations mailed to pt as pt requested. 6 mo recall for lab in Epic

## 2014-12-15 DIAGNOSIS — J988 Other specified respiratory disorders: Secondary | ICD-10-CM | POA: Diagnosis not present

## 2014-12-17 DIAGNOSIS — J029 Acute pharyngitis, unspecified: Secondary | ICD-10-CM | POA: Diagnosis not present

## 2014-12-19 ENCOUNTER — Other Ambulatory Visit: Payer: Self-pay | Admitting: Interventional Cardiology

## 2014-12-22 MED ORDER — LISINOPRIL 5 MG PO TABS: 5.0000 mg | ORAL_TABLET | Freq: Every day | ORAL | Status: DC

## 2014-12-22 NOTE — Addendum Note (Signed)
Addended by: Marlis Edelson C on: 12/22/2014 01:11 PM   Modules accepted: Orders

## 2014-12-23 ENCOUNTER — Other Ambulatory Visit: Payer: Self-pay | Admitting: Interventional Cardiology

## 2015-01-07 ENCOUNTER — Other Ambulatory Visit: Payer: Self-pay | Admitting: *Deleted

## 2015-01-07 DIAGNOSIS — J320 Chronic maxillary sinusitis: Secondary | ICD-10-CM | POA: Diagnosis not present

## 2015-01-07 MED ORDER — NITROGLYCERIN 0.4 MG SL SUBL: 0.4000 mg | SUBLINGUAL_TABLET | SUBLINGUAL | Status: DC | PRN

## 2015-02-05 DIAGNOSIS — H524 Presbyopia: Secondary | ICD-10-CM | POA: Diagnosis not present

## 2015-02-05 DIAGNOSIS — H04123 Dry eye syndrome of bilateral lacrimal glands: Secondary | ICD-10-CM | POA: Diagnosis not present

## 2015-02-05 DIAGNOSIS — H25813 Combined forms of age-related cataract, bilateral: Secondary | ICD-10-CM | POA: Diagnosis not present

## 2015-03-06 DIAGNOSIS — Z23 Encounter for immunization: Secondary | ICD-10-CM | POA: Diagnosis not present

## 2015-04-01 DIAGNOSIS — I10 Essential (primary) hypertension: Secondary | ICD-10-CM | POA: Diagnosis not present

## 2015-04-01 DIAGNOSIS — Z125 Encounter for screening for malignant neoplasm of prostate: Secondary | ICD-10-CM | POA: Diagnosis not present

## 2015-04-06 DIAGNOSIS — D1801 Hemangioma of skin and subcutaneous tissue: Secondary | ICD-10-CM | POA: Diagnosis not present

## 2015-04-06 DIAGNOSIS — B351 Tinea unguium: Secondary | ICD-10-CM | POA: Diagnosis not present

## 2015-04-06 DIAGNOSIS — D2371 Other benign neoplasm of skin of right lower limb, including hip: Secondary | ICD-10-CM | POA: Diagnosis not present

## 2015-04-06 DIAGNOSIS — L821 Other seborrheic keratosis: Secondary | ICD-10-CM | POA: Diagnosis not present

## 2015-04-08 DIAGNOSIS — I25119 Atherosclerotic heart disease of native coronary artery with unspecified angina pectoris: Secondary | ICD-10-CM | POA: Diagnosis not present

## 2015-04-08 DIAGNOSIS — N400 Enlarged prostate without lower urinary tract symptoms: Secondary | ICD-10-CM | POA: Diagnosis not present

## 2015-04-08 DIAGNOSIS — Z0000 Encounter for general adult medical examination without abnormal findings: Secondary | ICD-10-CM | POA: Diagnosis not present

## 2015-04-08 DIAGNOSIS — E782 Mixed hyperlipidemia: Secondary | ICD-10-CM | POA: Diagnosis not present

## 2015-04-08 DIAGNOSIS — K219 Gastro-esophageal reflux disease without esophagitis: Secondary | ICD-10-CM | POA: Diagnosis not present

## 2015-04-08 DIAGNOSIS — I10 Essential (primary) hypertension: Secondary | ICD-10-CM | POA: Diagnosis not present

## 2015-04-27 NOTE — Progress Notes (Signed)
Cardiology Office Note   Date:  04/27/2015   ID:  Chad Hopkins, DOB 23-Jan-1946, MRN [id number]  PCP:  Gennette Pac, MD  Cardiologist:  Sinclair Grooms, MD   Chief Complaint  Patient presents with  . Coronary Artery Disease      History of Present Illness: Chad Hopkins is a 69 y.o. male who presents for  Follow-up with coronary artery disease, right coronary and circumflex drug-eluting stents. Has history of hyperlipidemia.   Chad Hopkins is doing well. He denies angina. There is no claudication or neurological complaints. No medication side effects.    Past Medical History  Diagnosis Date  . Hypertension   . High cholesterol   . Osteoarthrosis, unspecified whether generalized or localized, other specified sites   . GERD (gastroesophageal reflux disease)   . Peripheral vascular complications   . Spondylolisthesis   . CAD (coronary artery disease)     hospitalized December 2009, had non-ST segment myocardial infarction seen by cardiologist Dr. Tamala Julian, has drug eluting stent in the right coronary artery and circumflex artery  . Shingles     Past Surgical History  Procedure Laterality Date  . Wisdom tooth extraction    . Foot surgery    . Cardiac catheterization      December of 09 with diagnosis of non-ST segment elevated myocardial infarction with drug eluting stent to the right coronary artery     Current Outpatient Prescriptions  Medication Sig Dispense Refill  . aspirin EC 81 MG tablet Take 1 tablet (81 mg total) by mouth daily.    . clarithromycin (BIAXIN) 500 MG tablet Take 1 tablet (500 mg total) by mouth 2 (two) times daily. 14 tablet 0  . fish oil-omega-3 fatty acids 1000 MG capsule Take 1 g by mouth 2 (two) times daily.     . Glucosamine-Chondroitin (GLUCOSAMINE CHONDR COMPLEX PO) Take 1 tablet by mouth daily.    Marland Kitchen lisinopril (PRINIVIL,ZESTRIL) 5 MG tablet Take 1 tablet (5 mg total) by mouth daily. 90 tablet 1  . Multiple Vitamin (MULITIVITAMIN  WITH MINERALS) TABS Take 1 tablet by mouth daily.    . nitroGLYCERIN (NITROSTAT) 0.4 MG SL tablet Place 1 tablet (0.4 mg total) under the tongue as needed. 25 tablet 3  . oseltamivir (TAMIFLU) 75 MG capsule Take 1 capsule (75 mg total) by mouth every 12 (twelve) hours. 10 capsule 0  . OVER THE COUNTER MEDICATION Take 1 tablet by mouth daily. [place]    . ranitidine (ZANTAC) 150 MG tablet Take 150 mg by mouth at bedtime.    Marland Kitchen RESVERATROL PO Take 1 capsule by mouth daily.    . rosuvastatin (CRESTOR) 20 MG tablet Take 0.5 tablets (10 mg total) by mouth daily. 30 tablet 11  . Saw Palmetto, Serenoa repens, (SAW PALMETTO PO) Take 1 tablet by mouth daily.     No current facility-administered medications for this visit.    Allergies:   Review of patient's allergies indicates no known allergies.    Social History:  The patient  reports that he has never smoked. He does not have any smokeless tobacco history on file. He reports that he does not drink alcohol or use illicit drugs.   Family History:  The patient's family history includes CAD in his mother; Cancer - Prostate in his brother; Lymphoma in his father; Melanoma in his father.    ROS:  Please see the history of present illness.   Otherwise, review of systems are positive for nausea and vomiting.  All other systems are reviewed and negative.    PHYSICAL EXAM: VS:  There were no vitals taken for this visit. , BMI There is no weight on file to calculate BMI. GEN: Well nourished, well developed, in no acute distress HEENT: normal Neck: no JVD, carotid bruits, or masses Cardiac:  RRR.  There is no murmur, rub, or gallop. There is no edema. Respiratory:  clear to auscultation bilaterally, normal work of breathing. GI: soft, nontender, nondistended, + BS MS: no deformity or atrophy Skin: warm and dry, no rash Neuro:  Strength and sensation are intact Psych: euthymic mood, full affect   EKG:  EKG is ordered today. The ekg reveals    Right bundle branch block, normal sinus rhythm, an otherwise unchanged from prior.   Recent Labs: 11/12/2014: ALT 12    Lipid Panel    Component Value Date/Time   CHOL 121 11/12/2014 0736   TRIG 85.0 11/12/2014 0736   HDL 40.00 11/12/2014 0736   CHOLHDL 3 11/12/2014 0736   VLDL 17.0 11/12/2014 0736   LDLCALC 64 11/12/2014 0736      Wt Readings from Last 3 Encounters:  04/04/14 154 lb (69.854 kg)  03/21/14 157 lb (71.215 kg)  03/21/13 158 lb (71.668 kg)      Other studies Reviewed: Additional studies/ records that were reviewed today include:  none. The findings include  none.    ASSESSMENT AND PLAN:  1. Atherosclerosis of native coronary artery of native heart without angina pectoris   asymptomatic  2. Right bundle branch block  unchanged  3. PAD (peripheral artery disease) (HCC)  no claudication  4. Essential hypertension, benign  controlled  5. Hyperlipidemia  followed by primary care. Reportedly at target    Current medicines are reviewed at length with the patient today.  The patient has the following concerns regarding medicines:  none.  The following changes/actions have been instituted:     continue aerobic activity  Labs/ tests ordered today include:  No orders of the defined types were placed in this encounter.     Disposition:   FU with HS in 1 year  Signed, Sinclair Grooms, MD  04/27/2015 6:01 PM    [facility] [place], [place]  [address] Phone: [phone]; Fax: [phone]

## 2015-04-28 ENCOUNTER — Ambulatory Visit (INDEPENDENT_AMBULATORY_CARE_PROVIDER_SITE_OTHER): Payer: Medicare Other | Admitting: Interventional Cardiology

## 2015-04-28 ENCOUNTER — Encounter: Payer: Self-pay | Admitting: Interventional Cardiology

## 2015-04-28 VITALS — BP 130/78 | HR 69 | Ht 68.0 in | Wt 154.1 lb

## 2015-04-28 DIAGNOSIS — I739 Peripheral vascular disease, unspecified: Secondary | ICD-10-CM

## 2015-04-28 DIAGNOSIS — I10 Essential (primary) hypertension: Secondary | ICD-10-CM

## 2015-04-28 DIAGNOSIS — I2510 Atherosclerotic heart disease of native coronary artery without angina pectoris: Secondary | ICD-10-CM

## 2015-04-28 DIAGNOSIS — I4510 Unspecified right bundle-branch block: Secondary | ICD-10-CM

## 2015-04-28 DIAGNOSIS — E785 Hyperlipidemia, unspecified: Secondary | ICD-10-CM

## 2015-04-28 NOTE — Patient Instructions (Signed)
Medication Instructions:  Your physician recommends that you continue on your current medications as directed. Please refer to the Current Medication list given to you today.   Labwork: None ordered  Testing/Procedures: None ordered  Follow-Up: Your physician wants you to follow-up in: 1 year with Dr.Smith You will receive a reminder letter in the mail two months in advance. If you don't receive a letter, please call our office to schedule the follow-up appointment.   Any Other Special Instructions Will Be Listed Below (If Applicable). Stay Active     If you need a refill on your cardiac medications before your next appointment, please call your pharmacy.

## 2015-05-01 DIAGNOSIS — H10012 Acute follicular conjunctivitis, left eye: Secondary | ICD-10-CM | POA: Diagnosis not present

## 2015-05-04 IMAGING — CR DG CHEST 2V
2 series · 2 of 2 positions shown · non-contrast
Comparison: 07/03/2013

CLINICAL DATA: Fever, chills, body aches, cough

EXAM:
CHEST  2 VIEW

[w chest pa]
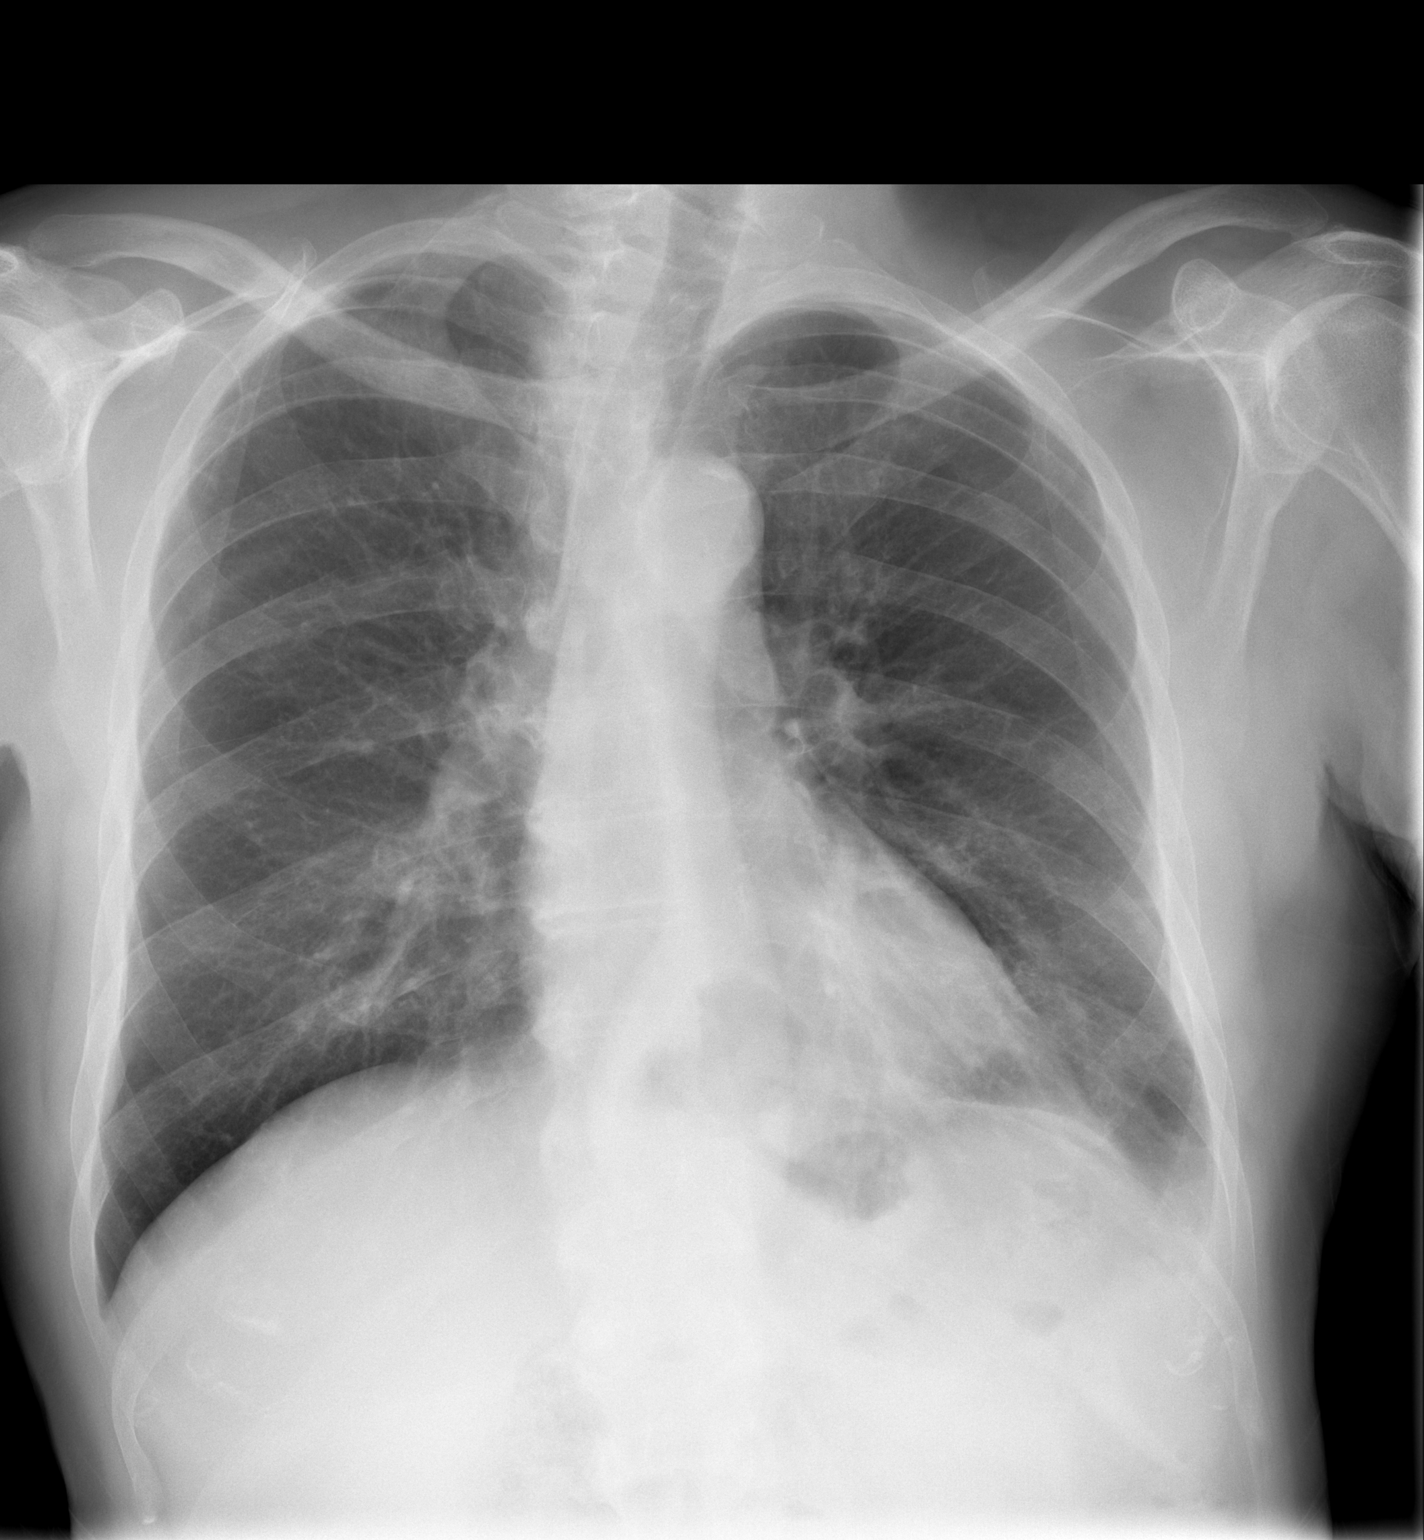

[w chest lat]
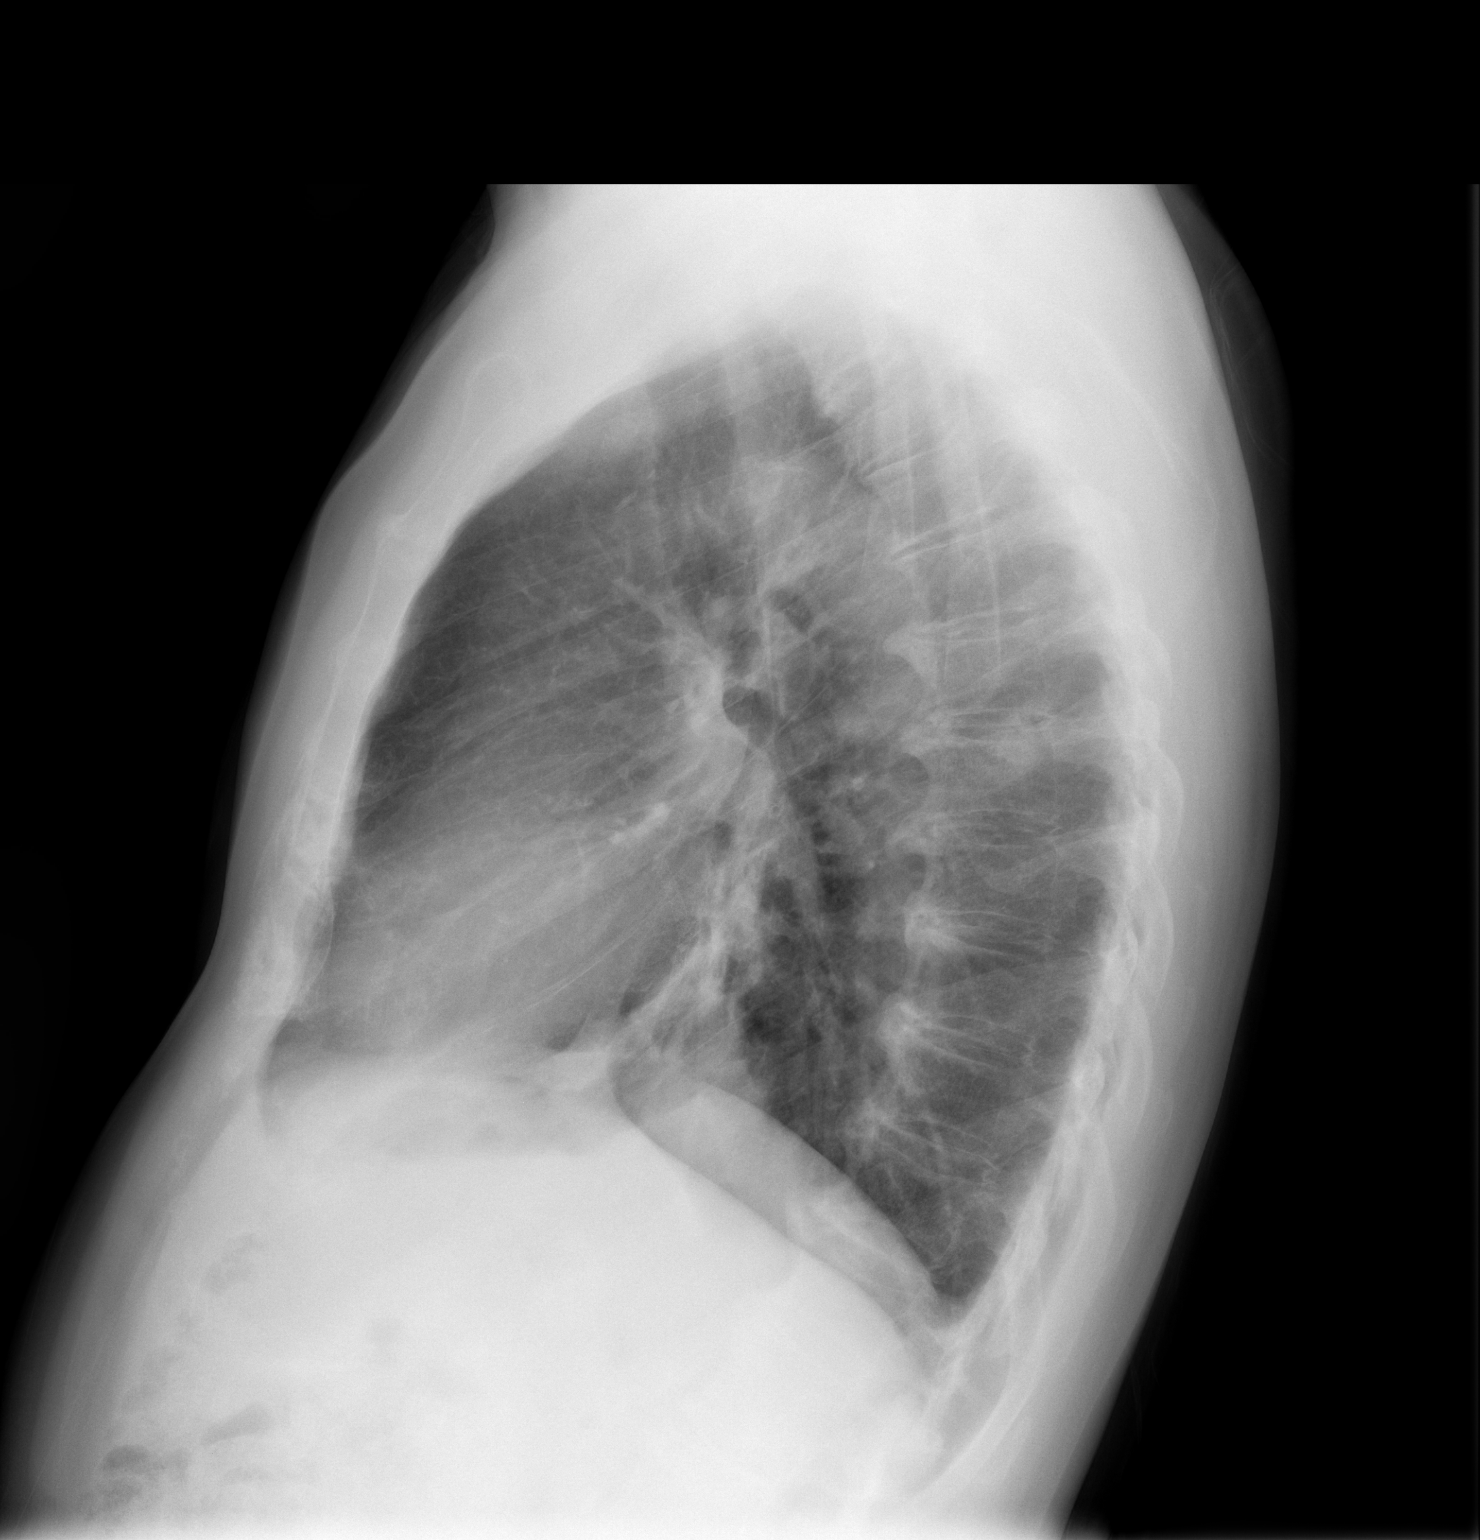

[2 of 2 positions shown; findings below may reference images not displayed]

FINDINGS: Cardiomediastinal silhouette is stable. Moderate size hiatal hernia
measures 5.7 cm. There is streaky left base retrocardiac atelectasis
or early infiltrate. No pulmonary edema.
IMPRESSION: Moderate size hiatal hernia. Streaky left base retrocardiac
atelectasis or early infiltrate.

## 2015-05-08 DIAGNOSIS — H04123 Dry eye syndrome of bilateral lacrimal glands: Secondary | ICD-10-CM | POA: Diagnosis not present

## 2015-05-08 DIAGNOSIS — H10012 Acute follicular conjunctivitis, left eye: Secondary | ICD-10-CM | POA: Diagnosis not present

## 2015-05-13 DIAGNOSIS — J320 Chronic maxillary sinusitis: Secondary | ICD-10-CM | POA: Diagnosis not present

## 2015-05-17 IMAGING — CR DG CHEST 2V
2 series · 2 of 2 positions shown · non-contrast
Comparison: Chest x-ray of 04/04/2014

CLINICAL DATA: History of pneumonia, follow-up

EXAM:
CHEST  2 VIEW

[view not recorded (1 of 2)]
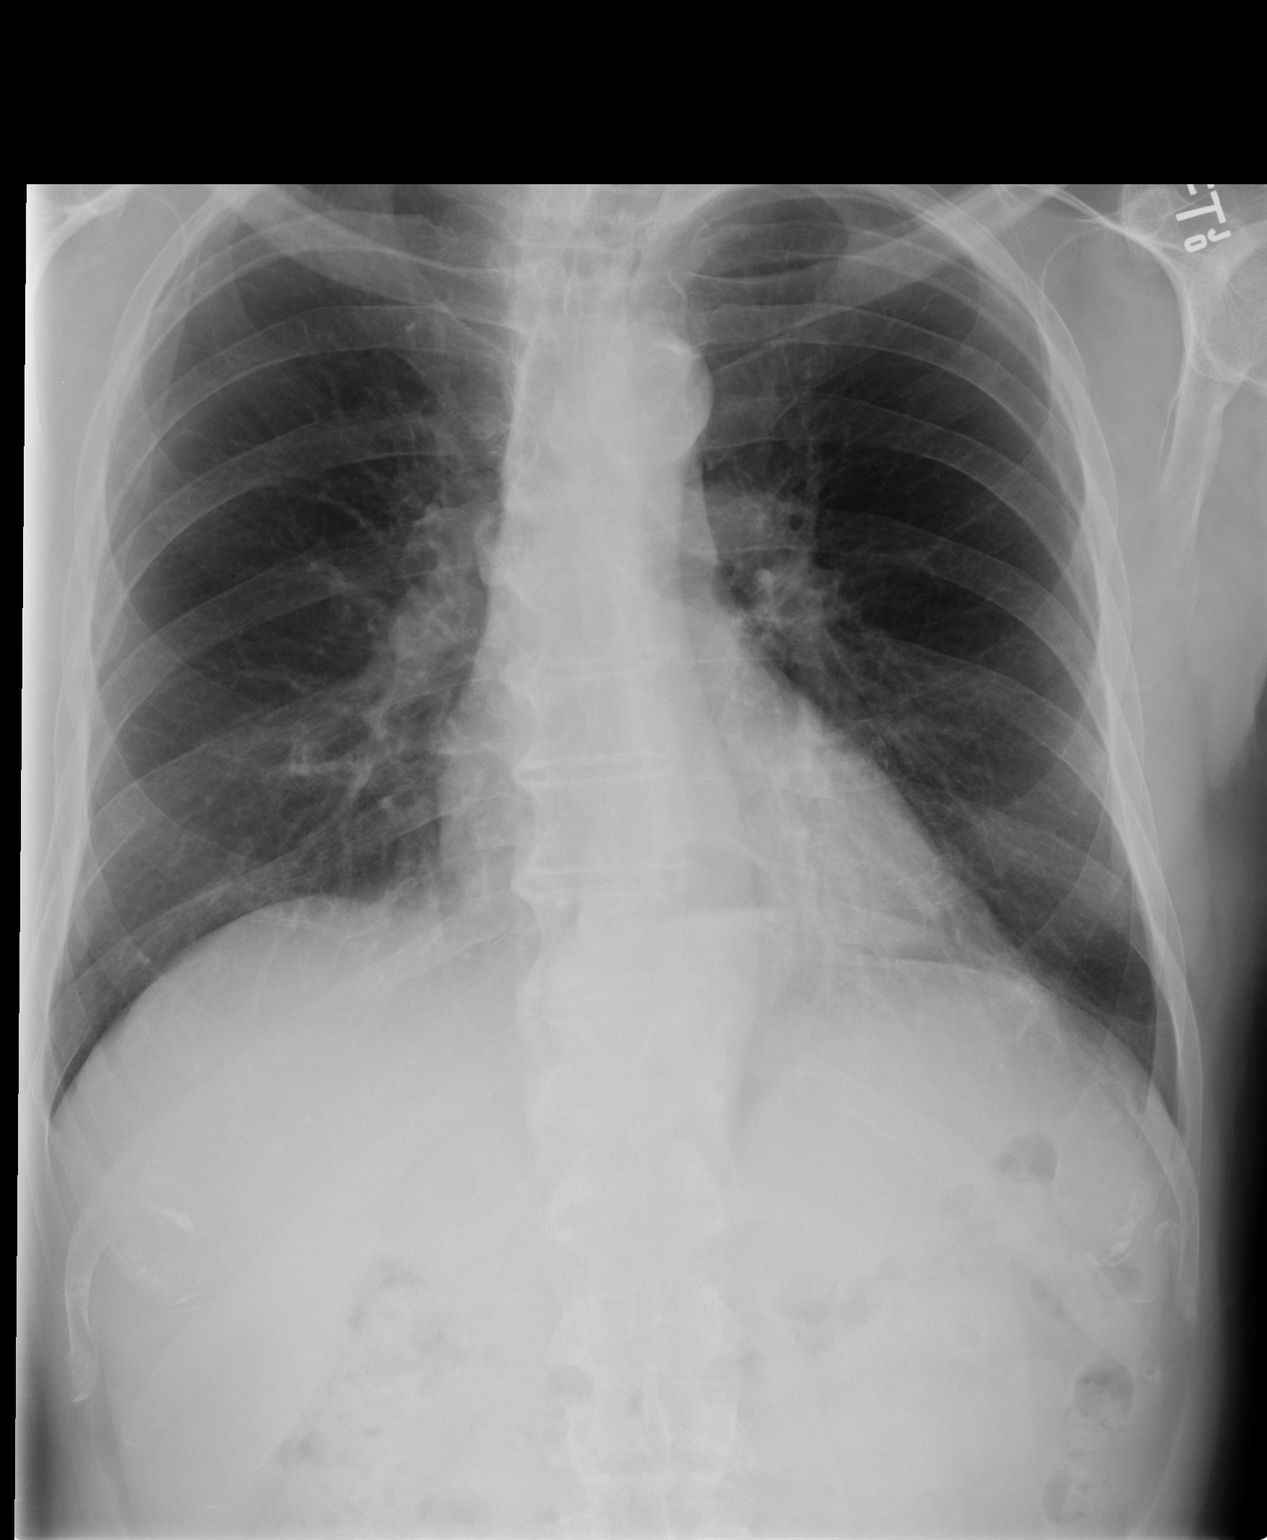

[view not recorded (2 of 2)]
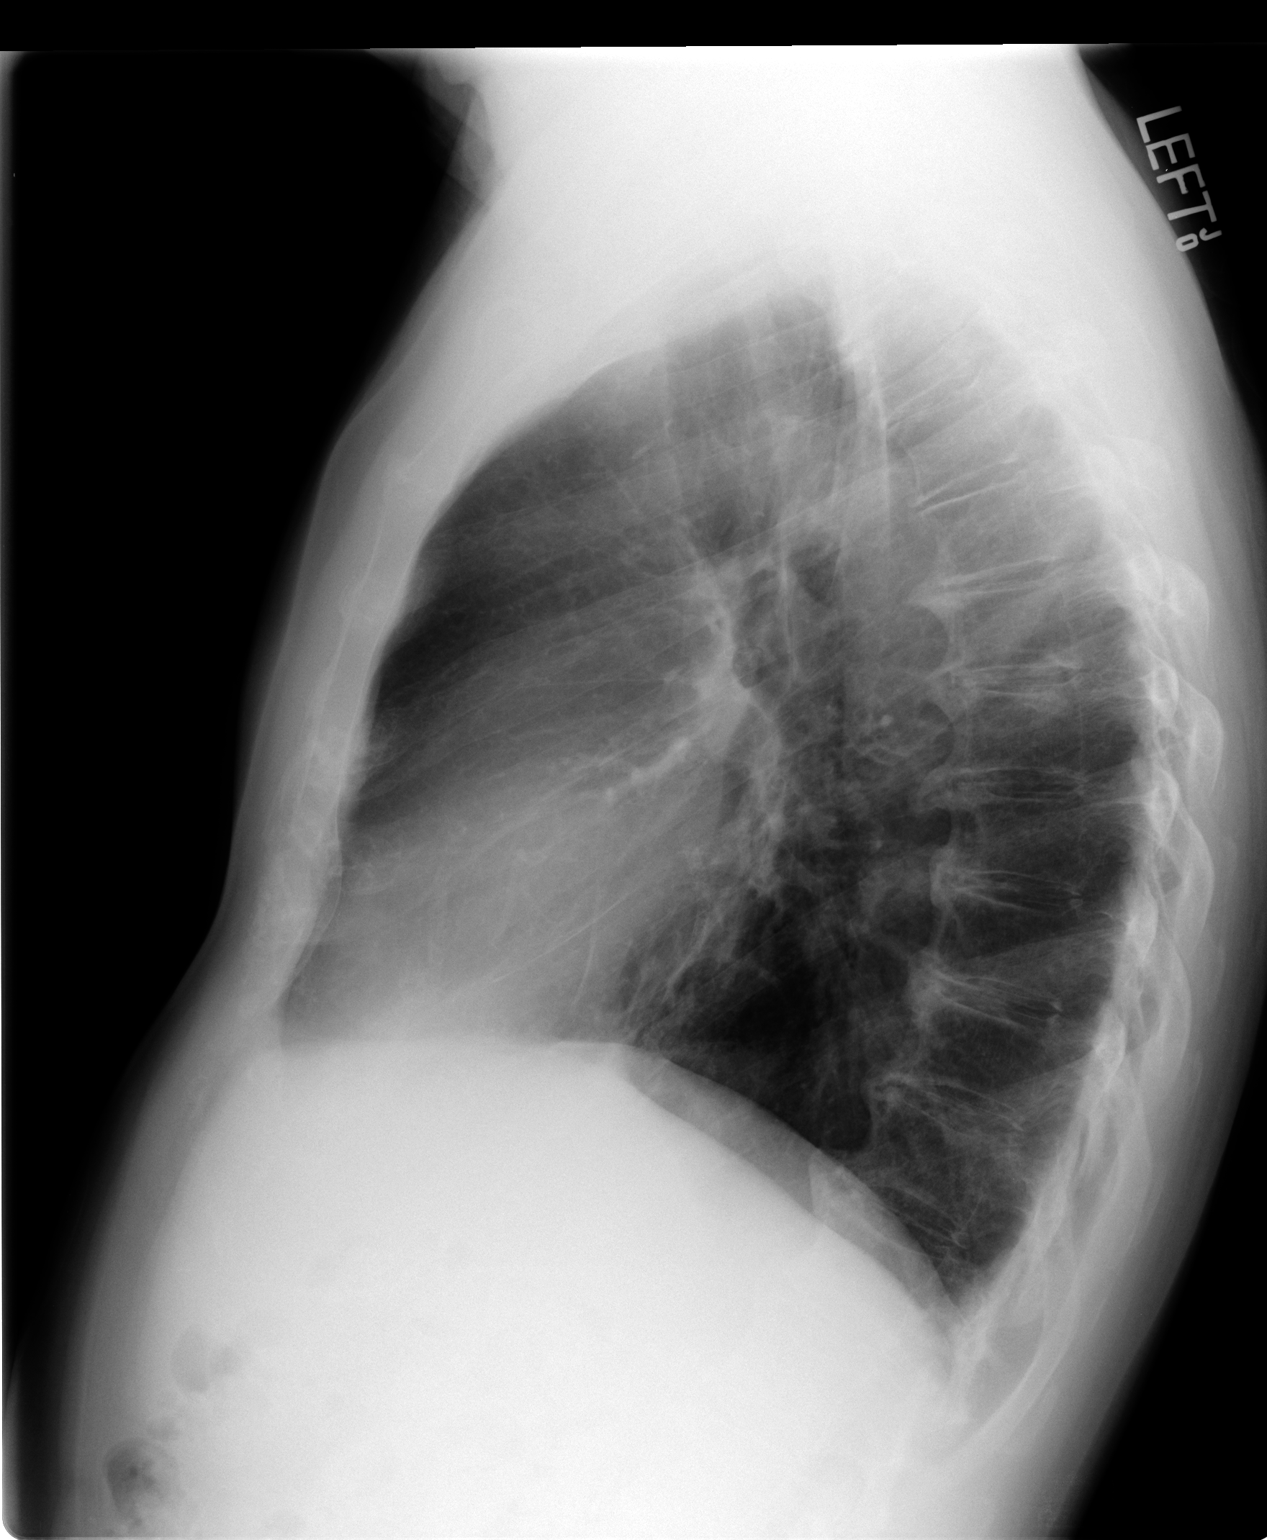

[2 of 2 positions shown; findings below may reference images not displayed]

FINDINGS: The parenchymal opacity in the anterior basal left lower lobe has
almost completely cleared with minimal linear streakiness on the
lateral view remaining. No new abnormality is noted. No effusion is
seen. A small hiatal hernia remains. The heart is unchanged in size.
There are degenerative changes throughout the thoracic spine.
IMPRESSION: Minimal streaky opacity remains in the anterior basal left lower
lobe. No new infiltrate or effusion is seen.

## 2015-06-20 ENCOUNTER — Other Ambulatory Visit: Payer: Self-pay | Admitting: Interventional Cardiology

## 2015-06-21 ENCOUNTER — Other Ambulatory Visit: Payer: Self-pay | Admitting: Interventional Cardiology

## 2015-06-29 DIAGNOSIS — L720 Epidermal cyst: Secondary | ICD-10-CM | POA: Diagnosis not present

## 2015-06-29 DIAGNOSIS — L821 Other seborrheic keratosis: Secondary | ICD-10-CM | POA: Diagnosis not present

## 2015-09-04 DIAGNOSIS — J320 Chronic maxillary sinusitis: Secondary | ICD-10-CM | POA: Diagnosis not present

## 2015-11-05 DIAGNOSIS — H25813 Combined forms of age-related cataract, bilateral: Secondary | ICD-10-CM | POA: Diagnosis not present

## 2015-11-05 DIAGNOSIS — H04123 Dry eye syndrome of bilateral lacrimal glands: Secondary | ICD-10-CM | POA: Diagnosis not present

## 2015-11-05 DIAGNOSIS — H524 Presbyopia: Secondary | ICD-10-CM | POA: Diagnosis not present

## 2016-01-19 DIAGNOSIS — L84 Corns and callosities: Secondary | ICD-10-CM | POA: Diagnosis not present

## 2016-01-19 DIAGNOSIS — M21621 Bunionette of right foot: Secondary | ICD-10-CM | POA: Diagnosis not present

## 2016-01-19 DIAGNOSIS — M722 Plantar fascial fibromatosis: Secondary | ICD-10-CM | POA: Diagnosis not present

## 2016-01-19 DIAGNOSIS — M21622 Bunionette of left foot: Secondary | ICD-10-CM | POA: Diagnosis not present

## 2016-02-17 DIAGNOSIS — M722 Plantar fascial fibromatosis: Secondary | ICD-10-CM | POA: Diagnosis not present

## 2016-03-10 ENCOUNTER — Other Ambulatory Visit: Payer: Self-pay | Admitting: Interventional Cardiology

## 2016-03-10 ENCOUNTER — Other Ambulatory Visit: Payer: Self-pay

## 2016-03-10 MED ORDER — ROSUVASTATIN CALCIUM 20 MG PO TABS: 10.0000 mg | ORAL_TABLET | Freq: Every day | ORAL | 0 refills | Status: DC

## 2016-03-14 DIAGNOSIS — D1801 Hemangioma of skin and subcutaneous tissue: Secondary | ICD-10-CM | POA: Diagnosis not present

## 2016-03-14 DIAGNOSIS — D225 Melanocytic nevi of trunk: Secondary | ICD-10-CM | POA: Diagnosis not present

## 2016-03-14 DIAGNOSIS — D2271 Melanocytic nevi of right lower limb, including hip: Secondary | ICD-10-CM | POA: Diagnosis not present

## 2016-03-14 DIAGNOSIS — L821 Other seborrheic keratosis: Secondary | ICD-10-CM | POA: Diagnosis not present

## 2016-03-14 DIAGNOSIS — L814 Other melanin hyperpigmentation: Secondary | ICD-10-CM | POA: Diagnosis not present

## 2016-04-11 ENCOUNTER — Encounter: Payer: Self-pay | Admitting: *Deleted

## 2016-04-12 DIAGNOSIS — Z1321 Encounter for screening for nutritional disorder: Secondary | ICD-10-CM | POA: Diagnosis not present

## 2016-04-12 DIAGNOSIS — I10 Essential (primary) hypertension: Secondary | ICD-10-CM | POA: Diagnosis not present

## 2016-04-12 DIAGNOSIS — Z125 Encounter for screening for malignant neoplasm of prostate: Secondary | ICD-10-CM | POA: Diagnosis not present

## 2016-04-13 DIAGNOSIS — I10 Essential (primary) hypertension: Secondary | ICD-10-CM | POA: Diagnosis not present

## 2016-04-13 DIAGNOSIS — Z125 Encounter for screening for malignant neoplasm of prostate: Secondary | ICD-10-CM | POA: Diagnosis not present

## 2016-04-13 DIAGNOSIS — D729 Disorder of white blood cells, unspecified: Secondary | ICD-10-CM | POA: Diagnosis not present

## 2016-04-13 DIAGNOSIS — N400 Enlarged prostate without lower urinary tract symptoms: Secondary | ICD-10-CM | POA: Diagnosis not present

## 2016-04-13 DIAGNOSIS — K219 Gastro-esophageal reflux disease without esophagitis: Secondary | ICD-10-CM | POA: Diagnosis not present

## 2016-04-13 DIAGNOSIS — E782 Mixed hyperlipidemia: Secondary | ICD-10-CM | POA: Diagnosis not present

## 2016-04-13 DIAGNOSIS — Z0000 Encounter for general adult medical examination without abnormal findings: Secondary | ICD-10-CM | POA: Diagnosis not present

## 2016-04-13 DIAGNOSIS — Z79899 Other long term (current) drug therapy: Secondary | ICD-10-CM | POA: Diagnosis not present

## 2016-04-13 DIAGNOSIS — R972 Elevated prostate specific antigen [PSA]: Secondary | ICD-10-CM | POA: Diagnosis not present

## 2016-04-13 DIAGNOSIS — I25119 Atherosclerotic heart disease of native coronary artery with unspecified angina pectoris: Secondary | ICD-10-CM | POA: Diagnosis not present

## 2016-04-13 DIAGNOSIS — Z86010 Personal history of colonic polyps: Secondary | ICD-10-CM | POA: Diagnosis not present

## 2016-04-15 ENCOUNTER — Ambulatory Visit (INDEPENDENT_AMBULATORY_CARE_PROVIDER_SITE_OTHER): Payer: Medicare Other | Admitting: Interventional Cardiology

## 2016-04-15 ENCOUNTER — Encounter: Payer: Self-pay | Admitting: Interventional Cardiology

## 2016-04-15 VITALS — BP 142/84 | HR 79 | Ht 67.5 in | Wt 156.2 lb

## 2016-04-15 DIAGNOSIS — I739 Peripheral vascular disease, unspecified: Secondary | ICD-10-CM | POA: Diagnosis not present

## 2016-04-15 DIAGNOSIS — I10 Essential (primary) hypertension: Secondary | ICD-10-CM | POA: Diagnosis not present

## 2016-04-15 DIAGNOSIS — I4510 Unspecified right bundle-branch block: Secondary | ICD-10-CM

## 2016-04-15 DIAGNOSIS — E784 Other hyperlipidemia: Secondary | ICD-10-CM

## 2016-04-15 DIAGNOSIS — E7849 Other hyperlipidemia: Secondary | ICD-10-CM

## 2016-04-15 DIAGNOSIS — I2510 Atherosclerotic heart disease of native coronary artery without angina pectoris: Secondary | ICD-10-CM | POA: Diagnosis not present

## 2016-04-15 MED ORDER — ROSUVASTATIN CALCIUM 20 MG PO TABS: 20.0000 mg | ORAL_TABLET | Freq: Every day | ORAL | 0 refills | Status: AC

## 2016-04-15 MED ORDER — NITROGLYCERIN 0.4 MG SL SUBL: 0.4000 mg | SUBLINGUAL_TABLET | SUBLINGUAL | 3 refills | Status: AC | PRN

## 2016-04-15 MED ORDER — LISINOPRIL 5 MG PO TABS: ORAL_TABLET | ORAL | 3 refills | Status: AC

## 2016-04-15 NOTE — Progress Notes (Signed)
Cardiology Office Note    Date:  04/15/2016   ID:  Chad Hopkins, DOB June 26, 1945, MRN [id number]  PCP:  Chad Pac, MD  Cardiologist: Sinclair Grooms, MD   Chief Complaint  Patient presents with  . Coronary Artery Disease    History of Present Illness:  Chad Hopkins is a 70 y.o. male for  Follow-up with coronary artery disease, right coronary and circumflex drug-eluting stentsIn 2009.Marland Kitchen Has history of hyperlipidemia, with LDL of 80 recently when checked at Chad Hopkins's office.Marland Kitchen  He is asymptomatic with reference to cardiopulmonary symptoms. He is physically active. He denies orthopnea PND.  Past Medical History:  Diagnosis Date  . CAD (coronary artery disease)    hospitalized December 2009, had non-ST segment myocardial infarction seen by cardiologist Chad. Tamala Julian, has drug eluting stent in the right coronary artery and circumflex artery  . GERD (gastroesophageal reflux disease)   . High cholesterol   . Hypertension   . Osteoarthrosis, unspecified whether generalized or localized, other specified sites   . Peripheral vascular complications   . Shingles   . Spondylolisthesis     Past Surgical History:  Procedure Laterality Date  . CARDIAC CATHETERIZATION     December of 09 with diagnosis of non-ST segment elevated myocardial infarction with drug eluting stent to the right coronary artery  . FOOT SURGERY    . WISDOM TOOTH EXTRACTION      Current Medications: Outpatient Medications Prior to Visit  Medication Sig Dispense Refill  . aspirin EC 81 MG tablet Take 1 tablet (81 mg total) by mouth daily.    . Glucosamine-Chondroitin (GLUCOSAMINE CHONDR COMPLEX PO) Take 1 tablet by mouth daily.    Marland Kitchen lisinopril (PRINIVIL,ZESTRIL) 5 MG tablet TAKE 1 TABLET (5 MG TOTAL) BY MOUTH DAILY. 90 tablet 3  . Multiple Vitamin (MULITIVITAMIN WITH MINERALS) TABS Take 1 tablet by mouth daily.    Marland Kitchen OVER THE COUNTER MEDICATION Take 1 tablet by mouth daily. [place]    .  ranitidine (ZANTAC) 75 MG tablet Take 75 mg by mouth at bedtime.    Marland Kitchen RESVERATROL PO Take 1 capsule by mouth daily.    . rosuvastatin (CRESTOR) 20 MG tablet Take 0.5 tablets (10 mg total) by mouth daily. Please keep 04/15/16 appt for further refills 30 tablet 0  . Saw Palmetto, Serenoa repens, (SAW PALMETTO PO) Take 1 tablet by mouth daily.    . nitroGLYCERIN (NITROSTAT) 0.4 MG SL tablet Place 1 tablet (0.4 mg total) under the tongue as needed. (Patient not taking: Reported on 04/15/2016) 25 tablet 3   No facility-administered medications prior to visit.      Allergies:   Patient has no known allergies.   Social History   Social History  . Marital status: Married    Spouse name: N/A  . Number of children: N/A  . Years of education: N/A   Social History Main Topics  . Smoking status: Never Smoker  . Smokeless tobacco: Never Used  . Alcohol use No  . Drug use: No  . Sexual activity: Not on file   Other Topics Concern  . Not on file   Social History Narrative  . No narrative on file     Family History:  The patient's family history includes CAD in his mother; Cancer - Prostate in his brother; Heart attack in his mother; Heart disease in his mother; Hypertension in his father; Lymphoma in his father; Melanoma in his father.   ROS:   Please  see the history of present illness.    Elevated at white count possible CLL in process of being evaluated. Otherwise no complaints. Most recent LDL was 80.  All other systems reviewed and are negative.   PHYSICAL EXAM:   VS:  BP (!) 142/84 (BP Location: Left Arm)   Pulse 79   Ht 5' 7.5" (1.715 m)   Wt 156 lb 3.2 oz (70.9 kg)   BMI 24.10 kg/m    GEN: Well nourished, well developed, in no acute distress  HEENT: normal  Neck: no JVD, carotid bruits, or masses Cardiac: RRR; no murmurs, rubs, or gallops,no edema  Respiratory:  clear to auscultation bilaterally, normal work of breathing GI: soft, nontender, nondistended, + BS MS: no  deformity or atrophy  Skin: warm and dry, no rash Neuro:  Alert and Oriented x 3, Strength and sensation are intact Psych: euthymic mood, full affect  Wt Readings from Last 3 Encounters:  04/15/16 156 lb 3.2 oz (70.9 kg)  04/28/15 154 lb 1.9 oz (69.9 kg)  04/04/14 154 lb (69.9 kg)      Studies/Labs Reviewed:   EKG:  EKG  Right bundle branch block, sinus rhythm, left atrial abnormality, left anterior hemiblock.  Recent Labs: No results found for requested labs within last 8760 hours.   Lipid Panel    Component Value Date/Time   CHOL 121 11/12/2014 0736   TRIG 85.0 11/12/2014 0736   HDL 40.00 11/12/2014 0736   CHOLHDL 3 11/12/2014 0736   VLDL 17.0 11/12/2014 0736   LDLCALC 64 11/12/2014 0736    Additional studies/ records that were reviewed today include:  No new data    ASSESSMENT:    1. Atherosclerosis of native coronary artery of native heart without angina pectoris   2. Essential hypertension, benign   3. PAD (peripheral artery disease) ([place])   4. Right bundle branch block   5. Other hyperlipidemia      PLAN:  In order of problems listed above:  1. Stable without angina. Remains physically active. 2. Well controlled with target blood pressure is 140/90 mmHg or less. 3. No complaints of claudication. Excellent pulses bilaterally. 4. Unchanged when compared to prior tracings. 5. Recent LDL of 80. Crestor is being increased to 20 mg per day.    Medication Adjustments/Labs and Tests Ordered: Current medicines are reviewed at length with the patient today.  Concerns regarding medicines are outlined above.  Medication changes, Labs and Tests ordered today are listed in the Patient Instructions below. There are no Patient Instructions on file for this visit.   Signed, Sinclair Grooms, MD  04/15/2016 8:51 AM    [facility] [address], [place], [place]  [address] Phone: [phone]; Fax: [phone]

## 2016-04-15 NOTE — Patient Instructions (Signed)
Medication Instructions:  1) START taking Crestor 20mg  once daily  Labwork: None  Testing/Procedures: None  Follow-Up: With new Cardiologist when you move.  Any Other Special Instructions Will Be Listed Below (If Applicable).     If you need a refill on your cardiac medications before your next appointment, please call your pharmacy.

## 2016-04-25 ENCOUNTER — Other Ambulatory Visit: Payer: Self-pay | Admitting: Interventional Cardiology

## 2016-05-12 DIAGNOSIS — R972 Elevated prostate specific antigen [PSA]: Secondary | ICD-10-CM | POA: Diagnosis not present

## 2016-05-12 DIAGNOSIS — K219 Gastro-esophageal reflux disease without esophagitis: Secondary | ICD-10-CM | POA: Diagnosis not present

## 2016-05-12 DIAGNOSIS — E782 Mixed hyperlipidemia: Secondary | ICD-10-CM | POA: Diagnosis not present

## 2016-05-12 DIAGNOSIS — Z0000 Encounter for general adult medical examination without abnormal findings: Secondary | ICD-10-CM | POA: Diagnosis not present

## 2016-05-12 DIAGNOSIS — Z79899 Other long term (current) drug therapy: Secondary | ICD-10-CM | POA: Diagnosis not present

## 2016-05-12 DIAGNOSIS — Z86010 Personal history of colonic polyps: Secondary | ICD-10-CM | POA: Diagnosis not present

## 2016-05-12 DIAGNOSIS — I10 Essential (primary) hypertension: Secondary | ICD-10-CM | POA: Diagnosis not present

## 2016-05-12 DIAGNOSIS — I25119 Atherosclerotic heart disease of native coronary artery with unspecified angina pectoris: Secondary | ICD-10-CM | POA: Diagnosis not present

## 2016-05-12 DIAGNOSIS — D729 Disorder of white blood cells, unspecified: Secondary | ICD-10-CM | POA: Diagnosis not present

## 2016-05-12 DIAGNOSIS — N400 Enlarged prostate without lower urinary tract symptoms: Secondary | ICD-10-CM | POA: Diagnosis not present

## 2016-05-12 DIAGNOSIS — Z125 Encounter for screening for malignant neoplasm of prostate: Secondary | ICD-10-CM | POA: Diagnosis not present

## 2016-05-18 DIAGNOSIS — D72829 Elevated white blood cell count, unspecified: Secondary | ICD-10-CM | POA: Diagnosis not present

## 2016-05-18 DIAGNOSIS — C9110 Chronic lymphocytic leukemia of B-cell type not having achieved remission: Secondary | ICD-10-CM | POA: Diagnosis not present

## 2016-05-18 DIAGNOSIS — K449 Diaphragmatic hernia without obstruction or gangrene: Secondary | ICD-10-CM | POA: Diagnosis not present

## 2016-05-19 DIAGNOSIS — D72829 Elevated white blood cell count, unspecified: Secondary | ICD-10-CM | POA: Diagnosis not present

## 2016-05-19 DIAGNOSIS — D47Z9 Other specified neoplasms of uncertain behavior of lymphoid, hematopoietic and related tissue: Secondary | ICD-10-CM | POA: Diagnosis not present

## 2016-06-02 DIAGNOSIS — C9590 Leukemia, unspecified not having achieved remission: Secondary | ICD-10-CM | POA: Diagnosis not present

## 2016-06-02 DIAGNOSIS — E7800 Pure hypercholesterolemia, unspecified: Secondary | ICD-10-CM | POA: Diagnosis not present

## 2016-06-02 DIAGNOSIS — C9110 Chronic lymphocytic leukemia of B-cell type not having achieved remission: Secondary | ICD-10-CM | POA: Diagnosis not present

## 2016-06-02 DIAGNOSIS — I10 Essential (primary) hypertension: Secondary | ICD-10-CM | POA: Diagnosis not present

## 2016-06-02 DIAGNOSIS — I2510 Atherosclerotic heart disease of native coronary artery without angina pectoris: Secondary | ICD-10-CM | POA: Diagnosis not present

## 2016-06-07 DIAGNOSIS — R3912 Poor urinary stream: Secondary | ICD-10-CM | POA: Diagnosis not present

## 2016-06-07 DIAGNOSIS — N401 Enlarged prostate with lower urinary tract symptoms: Secondary | ICD-10-CM | POA: Diagnosis not present

## 2016-06-07 DIAGNOSIS — R972 Elevated prostate specific antigen [PSA]: Secondary | ICD-10-CM | POA: Diagnosis not present

## 2016-06-08 DIAGNOSIS — Z79899 Other long term (current) drug therapy: Secondary | ICD-10-CM | POA: Diagnosis not present

## 2016-06-08 DIAGNOSIS — Z9861 Coronary angioplasty status: Secondary | ICD-10-CM | POA: Diagnosis not present

## 2016-06-08 DIAGNOSIS — K219 Gastro-esophageal reflux disease without esophagitis: Secondary | ICD-10-CM | POA: Diagnosis not present

## 2016-06-08 DIAGNOSIS — I10 Essential (primary) hypertension: Secondary | ICD-10-CM | POA: Diagnosis not present

## 2016-06-08 DIAGNOSIS — I2510 Atherosclerotic heart disease of native coronary artery without angina pectoris: Secondary | ICD-10-CM | POA: Diagnosis not present

## 2016-06-08 DIAGNOSIS — R972 Elevated prostate specific antigen [PSA]: Secondary | ICD-10-CM | POA: Diagnosis not present

## 2016-06-08 DIAGNOSIS — E785 Hyperlipidemia, unspecified: Secondary | ICD-10-CM | POA: Diagnosis not present

## 2016-06-08 DIAGNOSIS — Z86010 Personal history of colonic polyps: Secondary | ICD-10-CM | POA: Diagnosis not present

## 2016-06-08 DIAGNOSIS — D72829 Elevated white blood cell count, unspecified: Secondary | ICD-10-CM | POA: Diagnosis not present

## 2016-07-28 DIAGNOSIS — C9110 Chronic lymphocytic leukemia of B-cell type not having achieved remission: Secondary | ICD-10-CM | POA: Diagnosis not present

## 2016-09-07 DIAGNOSIS — D72829 Elevated white blood cell count, unspecified: Secondary | ICD-10-CM | POA: Diagnosis not present

## 2016-09-07 DIAGNOSIS — Z9861 Coronary angioplasty status: Secondary | ICD-10-CM | POA: Diagnosis not present

## 2016-09-07 DIAGNOSIS — R490 Dysphonia: Secondary | ICD-10-CM | POA: Diagnosis not present

## 2016-09-07 DIAGNOSIS — Z79899 Other long term (current) drug therapy: Secondary | ICD-10-CM | POA: Diagnosis not present

## 2016-09-07 DIAGNOSIS — J029 Acute pharyngitis, unspecified: Secondary | ICD-10-CM | POA: Diagnosis not present

## 2016-09-07 DIAGNOSIS — I2510 Atherosclerotic heart disease of native coronary artery without angina pectoris: Secondary | ICD-10-CM | POA: Diagnosis not present

## 2016-09-07 DIAGNOSIS — K219 Gastro-esophageal reflux disease without esophagitis: Secondary | ICD-10-CM | POA: Diagnosis not present

## 2016-09-07 DIAGNOSIS — I10 Essential (primary) hypertension: Secondary | ICD-10-CM | POA: Diagnosis not present

## 2016-09-08 DIAGNOSIS — K219 Gastro-esophageal reflux disease without esophagitis: Secondary | ICD-10-CM | POA: Diagnosis not present

## 2016-09-08 DIAGNOSIS — C9110 Chronic lymphocytic leukemia of B-cell type not having achieved remission: Secondary | ICD-10-CM | POA: Diagnosis not present

## 2016-09-10 ENCOUNTER — Other Ambulatory Visit: Payer: Self-pay | Admitting: Interventional Cardiology

## 2016-09-12 DIAGNOSIS — J384 Edema of larynx: Secondary | ICD-10-CM | POA: Diagnosis not present

## 2016-09-12 DIAGNOSIS — J387 Other diseases of larynx: Secondary | ICD-10-CM | POA: Diagnosis not present

## 2016-09-12 DIAGNOSIS — R0989 Other specified symptoms and signs involving the circulatory and respiratory systems: Secondary | ICD-10-CM | POA: Diagnosis not present

## 2016-09-13 DIAGNOSIS — C9110 Chronic lymphocytic leukemia of B-cell type not having achieved remission: Secondary | ICD-10-CM | POA: Diagnosis not present

## 2016-09-15 DIAGNOSIS — H11421 Conjunctival edema, right eye: Secondary | ICD-10-CM | POA: Diagnosis not present

## 2016-09-22 DIAGNOSIS — Z9861 Coronary angioplasty status: Secondary | ICD-10-CM | POA: Diagnosis not present

## 2016-09-22 DIAGNOSIS — Z79899 Other long term (current) drug therapy: Secondary | ICD-10-CM | POA: Diagnosis not present

## 2016-09-22 DIAGNOSIS — I10 Essential (primary) hypertension: Secondary | ICD-10-CM | POA: Diagnosis not present

## 2016-09-22 DIAGNOSIS — H9201 Otalgia, right ear: Secondary | ICD-10-CM | POA: Diagnosis not present

## 2016-09-22 DIAGNOSIS — K219 Gastro-esophageal reflux disease without esophagitis: Secondary | ICD-10-CM | POA: Diagnosis not present

## 2016-09-22 DIAGNOSIS — J029 Acute pharyngitis, unspecified: Secondary | ICD-10-CM | POA: Diagnosis not present

## 2016-09-22 DIAGNOSIS — I2510 Atherosclerotic heart disease of native coronary artery without angina pectoris: Secondary | ICD-10-CM | POA: Diagnosis not present

## 2016-09-22 DIAGNOSIS — R070 Pain in throat: Secondary | ICD-10-CM | POA: Diagnosis not present

## 2016-09-22 DIAGNOSIS — C9110 Chronic lymphocytic leukemia of B-cell type not having achieved remission: Secondary | ICD-10-CM | POA: Diagnosis not present

## 2016-09-22 DIAGNOSIS — D72829 Elevated white blood cell count, unspecified: Secondary | ICD-10-CM | POA: Diagnosis not present

## 2016-09-26 DIAGNOSIS — J384 Edema of larynx: Secondary | ICD-10-CM | POA: Diagnosis not present

## 2016-09-26 DIAGNOSIS — R070 Pain in throat: Secondary | ICD-10-CM | POA: Diagnosis not present

## 2016-09-29 DIAGNOSIS — I10 Essential (primary) hypertension: Secondary | ICD-10-CM | POA: Diagnosis not present

## 2016-09-29 DIAGNOSIS — E785 Hyperlipidemia, unspecified: Secondary | ICD-10-CM | POA: Diagnosis not present

## 2016-09-29 DIAGNOSIS — Z79899 Other long term (current) drug therapy: Secondary | ICD-10-CM | POA: Diagnosis not present

## 2016-09-29 DIAGNOSIS — R599 Enlarged lymph nodes, unspecified: Secondary | ICD-10-CM | POA: Diagnosis not present

## 2016-09-29 DIAGNOSIS — C9110 Chronic lymphocytic leukemia of B-cell type not having achieved remission: Secondary | ICD-10-CM | POA: Diagnosis not present

## 2016-10-04 DIAGNOSIS — C9110 Chronic lymphocytic leukemia of B-cell type not having achieved remission: Secondary | ICD-10-CM | POA: Diagnosis not present

## 2016-10-04 DIAGNOSIS — R599 Enlarged lymph nodes, unspecified: Secondary | ICD-10-CM | POA: Diagnosis not present

## 2016-10-10 DIAGNOSIS — J358 Other chronic diseases of tonsils and adenoids: Secondary | ICD-10-CM | POA: Diagnosis not present

## 2016-10-10 DIAGNOSIS — R070 Pain in throat: Secondary | ICD-10-CM | POA: Diagnosis not present

## 2016-10-19 DIAGNOSIS — J358 Other chronic diseases of tonsils and adenoids: Secondary | ICD-10-CM | POA: Diagnosis not present

## 2016-10-19 DIAGNOSIS — R070 Pain in throat: Secondary | ICD-10-CM | POA: Diagnosis not present

## 2016-10-25 DIAGNOSIS — D3501 Benign neoplasm of right adrenal gland: Secondary | ICD-10-CM | POA: Diagnosis not present

## 2016-10-25 DIAGNOSIS — C9110 Chronic lymphocytic leukemia of B-cell type not having achieved remission: Secondary | ICD-10-CM | POA: Diagnosis not present

## 2016-10-25 DIAGNOSIS — D630 Anemia in neoplastic disease: Secondary | ICD-10-CM | POA: Diagnosis not present

## 2016-10-27 DIAGNOSIS — K219 Gastro-esophageal reflux disease without esophagitis: Secondary | ICD-10-CM | POA: Diagnosis not present

## 2016-10-27 DIAGNOSIS — R599 Enlarged lymph nodes, unspecified: Secondary | ICD-10-CM | POA: Diagnosis not present

## 2016-10-27 DIAGNOSIS — R070 Pain in throat: Secondary | ICD-10-CM | POA: Diagnosis not present

## 2016-10-27 DIAGNOSIS — C9110 Chronic lymphocytic leukemia of B-cell type not having achieved remission: Secondary | ICD-10-CM | POA: Diagnosis not present

## 2016-12-08 DIAGNOSIS — C9110 Chronic lymphocytic leukemia of B-cell type not having achieved remission: Secondary | ICD-10-CM | POA: Diagnosis not present

## 2016-12-08 DIAGNOSIS — R599 Enlarged lymph nodes, unspecified: Secondary | ICD-10-CM | POA: Diagnosis not present

## 2016-12-08 DIAGNOSIS — R070 Pain in throat: Secondary | ICD-10-CM | POA: Diagnosis not present

## 2016-12-08 DIAGNOSIS — K219 Gastro-esophageal reflux disease without esophagitis: Secondary | ICD-10-CM | POA: Diagnosis not present

## 2016-12-19 DIAGNOSIS — R070 Pain in throat: Secondary | ICD-10-CM | POA: Diagnosis not present

## 2016-12-20 DIAGNOSIS — R972 Elevated prostate specific antigen [PSA]: Secondary | ICD-10-CM | POA: Diagnosis not present

## 2016-12-21 DIAGNOSIS — E7800 Pure hypercholesterolemia, unspecified: Secondary | ICD-10-CM | POA: Diagnosis not present

## 2016-12-21 DIAGNOSIS — I2510 Atherosclerotic heart disease of native coronary artery without angina pectoris: Secondary | ICD-10-CM | POA: Diagnosis not present

## 2016-12-21 DIAGNOSIS — E782 Mixed hyperlipidemia: Secondary | ICD-10-CM | POA: Diagnosis not present

## 2016-12-21 DIAGNOSIS — I10 Essential (primary) hypertension: Secondary | ICD-10-CM | POA: Diagnosis not present

## 2016-12-27 DIAGNOSIS — R3912 Poor urinary stream: Secondary | ICD-10-CM | POA: Diagnosis not present

## 2016-12-27 DIAGNOSIS — R972 Elevated prostate specific antigen [PSA]: Secondary | ICD-10-CM | POA: Diagnosis not present

## 2016-12-27 DIAGNOSIS — N401 Enlarged prostate with lower urinary tract symptoms: Secondary | ICD-10-CM | POA: Diagnosis not present

## 2017-01-05 DIAGNOSIS — C9110 Chronic lymphocytic leukemia of B-cell type not having achieved remission: Secondary | ICD-10-CM | POA: Diagnosis not present

## 2017-01-05 DIAGNOSIS — R911 Solitary pulmonary nodule: Secondary | ICD-10-CM | POA: Diagnosis not present

## 2017-01-05 DIAGNOSIS — R590 Localized enlarged lymph nodes: Secondary | ICD-10-CM | POA: Diagnosis not present

## 2017-01-05 DIAGNOSIS — R599 Enlarged lymph nodes, unspecified: Secondary | ICD-10-CM | POA: Diagnosis not present

## 2017-01-16 DIAGNOSIS — K219 Gastro-esophageal reflux disease without esophagitis: Secondary | ICD-10-CM | POA: Diagnosis not present

## 2017-01-16 DIAGNOSIS — C9110 Chronic lymphocytic leukemia of B-cell type not having achieved remission: Secondary | ICD-10-CM | POA: Diagnosis not present

## 2017-01-16 DIAGNOSIS — R599 Enlarged lymph nodes, unspecified: Secondary | ICD-10-CM | POA: Diagnosis not present

## 2017-01-16 DIAGNOSIS — R972 Elevated prostate specific antigen [PSA]: Secondary | ICD-10-CM | POA: Diagnosis not present

## 2017-01-16 DIAGNOSIS — R070 Pain in throat: Secondary | ICD-10-CM | POA: Diagnosis not present

## 2017-01-16 DIAGNOSIS — D801 Nonfamilial hypogammaglobulinemia: Secondary | ICD-10-CM | POA: Diagnosis not present

## 2017-01-30 DIAGNOSIS — I2510 Atherosclerotic heart disease of native coronary artery without angina pectoris: Secondary | ICD-10-CM | POA: Diagnosis not present

## 2017-01-30 DIAGNOSIS — I340 Nonrheumatic mitral (valve) insufficiency: Secondary | ICD-10-CM | POA: Diagnosis not present

## 2017-02-03 DIAGNOSIS — R972 Elevated prostate specific antigen [PSA]: Secondary | ICD-10-CM | POA: Diagnosis not present

## 2017-02-14 DIAGNOSIS — R3912 Poor urinary stream: Secondary | ICD-10-CM | POA: Diagnosis not present

## 2017-02-14 DIAGNOSIS — R972 Elevated prostate specific antigen [PSA]: Secondary | ICD-10-CM | POA: Diagnosis not present

## 2017-02-14 DIAGNOSIS — N401 Enlarged prostate with lower urinary tract symptoms: Secondary | ICD-10-CM | POA: Diagnosis not present

## 2017-03-08 DIAGNOSIS — H5213 Myopia, bilateral: Secondary | ICD-10-CM | POA: Diagnosis not present

## 2017-03-08 DIAGNOSIS — H25813 Combined forms of age-related cataract, bilateral: Secondary | ICD-10-CM | POA: Diagnosis not present

## 2017-03-08 DIAGNOSIS — H524 Presbyopia: Secondary | ICD-10-CM | POA: Diagnosis not present

## 2017-03-15 DIAGNOSIS — L578 Other skin changes due to chronic exposure to nonionizing radiation: Secondary | ICD-10-CM | POA: Diagnosis not present

## 2017-03-15 DIAGNOSIS — D225 Melanocytic nevi of trunk: Secondary | ICD-10-CM | POA: Diagnosis not present

## 2017-03-15 DIAGNOSIS — Z1283 Encounter for screening for malignant neoplasm of skin: Secondary | ICD-10-CM | POA: Diagnosis not present

## 2017-03-29 DIAGNOSIS — R972 Elevated prostate specific antigen [PSA]: Secondary | ICD-10-CM | POA: Diagnosis not present

## 2017-04-06 DIAGNOSIS — R3912 Poor urinary stream: Secondary | ICD-10-CM | POA: Diagnosis not present

## 2017-04-06 DIAGNOSIS — R972 Elevated prostate specific antigen [PSA]: Secondary | ICD-10-CM | POA: Diagnosis not present

## 2017-04-06 DIAGNOSIS — N401 Enlarged prostate with lower urinary tract symptoms: Secondary | ICD-10-CM | POA: Diagnosis not present

## 2017-04-27 DIAGNOSIS — R070 Pain in throat: Secondary | ICD-10-CM | POA: Diagnosis not present

## 2017-04-27 DIAGNOSIS — D801 Nonfamilial hypogammaglobulinemia: Secondary | ICD-10-CM | POA: Diagnosis not present

## 2017-04-27 DIAGNOSIS — K219 Gastro-esophageal reflux disease without esophagitis: Secondary | ICD-10-CM | POA: Diagnosis not present

## 2017-04-27 DIAGNOSIS — R599 Enlarged lymph nodes, unspecified: Secondary | ICD-10-CM | POA: Diagnosis not present

## 2017-04-27 DIAGNOSIS — C9110 Chronic lymphocytic leukemia of B-cell type not having achieved remission: Secondary | ICD-10-CM | POA: Diagnosis not present

## 2017-05-11 DIAGNOSIS — Z9861 Coronary angioplasty status: Secondary | ICD-10-CM | POA: Diagnosis not present

## 2017-05-11 DIAGNOSIS — I2510 Atherosclerotic heart disease of native coronary artery without angina pectoris: Secondary | ICD-10-CM | POA: Diagnosis not present

## 2017-05-11 DIAGNOSIS — Z79899 Other long term (current) drug therapy: Secondary | ICD-10-CM | POA: Diagnosis not present

## 2017-05-11 DIAGNOSIS — I10 Essential (primary) hypertension: Secondary | ICD-10-CM | POA: Diagnosis not present

## 2017-05-11 DIAGNOSIS — Z1159 Encounter for screening for other viral diseases: Secondary | ICD-10-CM | POA: Diagnosis not present

## 2017-05-11 DIAGNOSIS — E559 Vitamin D deficiency, unspecified: Secondary | ICD-10-CM | POA: Diagnosis not present

## 2017-05-11 DIAGNOSIS — E785 Hyperlipidemia, unspecified: Secondary | ICD-10-CM | POA: Diagnosis not present

## 2017-05-18 DIAGNOSIS — I2510 Atherosclerotic heart disease of native coronary artery without angina pectoris: Secondary | ICD-10-CM | POA: Diagnosis not present

## 2017-05-18 DIAGNOSIS — Z7189 Other specified counseling: Secondary | ICD-10-CM | POA: Diagnosis not present

## 2017-05-18 DIAGNOSIS — Z0000 Encounter for general adult medical examination without abnormal findings: Secondary | ICD-10-CM | POA: Diagnosis not present

## 2017-05-18 DIAGNOSIS — Z1211 Encounter for screening for malignant neoplasm of colon: Secondary | ICD-10-CM | POA: Diagnosis not present

## 2017-05-18 DIAGNOSIS — C9190 Lymphoid leukemia, unspecified not having achieved remission: Secondary | ICD-10-CM | POA: Diagnosis not present

## 2017-05-18 DIAGNOSIS — I10 Essential (primary) hypertension: Secondary | ICD-10-CM | POA: Diagnosis not present

## 2017-05-18 DIAGNOSIS — Z9861 Coronary angioplasty status: Secondary | ICD-10-CM | POA: Diagnosis not present

## 2017-05-18 DIAGNOSIS — K219 Gastro-esophageal reflux disease without esophagitis: Secondary | ICD-10-CM | POA: Diagnosis not present

## 2017-05-18 DIAGNOSIS — Z79899 Other long term (current) drug therapy: Secondary | ICD-10-CM | POA: Diagnosis not present

## 2017-05-18 DIAGNOSIS — E785 Hyperlipidemia, unspecified: Secondary | ICD-10-CM | POA: Diagnosis not present

## 2017-05-18 DIAGNOSIS — Z1212 Encounter for screening for malignant neoplasm of rectum: Secondary | ICD-10-CM | POA: Diagnosis not present

## 2017-05-25 DIAGNOSIS — Z1212 Encounter for screening for malignant neoplasm of rectum: Secondary | ICD-10-CM | POA: Diagnosis not present

## 2017-05-25 DIAGNOSIS — Z1211 Encounter for screening for malignant neoplasm of colon: Secondary | ICD-10-CM | POA: Diagnosis not present

## 2017-07-06 DIAGNOSIS — C9190 Lymphoid leukemia, unspecified not having achieved remission: Secondary | ICD-10-CM | POA: Diagnosis not present

## 2017-07-06 DIAGNOSIS — J209 Acute bronchitis, unspecified: Secondary | ICD-10-CM | POA: Diagnosis not present

## 2017-07-06 DIAGNOSIS — I2510 Atherosclerotic heart disease of native coronary artery without angina pectoris: Secondary | ICD-10-CM | POA: Diagnosis not present

## 2017-07-06 DIAGNOSIS — Z79899 Other long term (current) drug therapy: Secondary | ICD-10-CM | POA: Diagnosis not present

## 2017-07-06 DIAGNOSIS — J0110 Acute frontal sinusitis, unspecified: Secondary | ICD-10-CM | POA: Diagnosis not present

## 2017-07-06 DIAGNOSIS — Z9861 Coronary angioplasty status: Secondary | ICD-10-CM | POA: Diagnosis not present

## 2017-07-06 DIAGNOSIS — I10 Essential (primary) hypertension: Secondary | ICD-10-CM | POA: Diagnosis not present

## 2017-07-26 DIAGNOSIS — I10 Essential (primary) hypertension: Secondary | ICD-10-CM | POA: Diagnosis not present

## 2017-07-26 DIAGNOSIS — Z79899 Other long term (current) drug therapy: Secondary | ICD-10-CM | POA: Diagnosis not present

## 2017-07-26 DIAGNOSIS — J0110 Acute frontal sinusitis, unspecified: Secondary | ICD-10-CM | POA: Diagnosis not present

## 2017-07-26 DIAGNOSIS — I2510 Atherosclerotic heart disease of native coronary artery without angina pectoris: Secondary | ICD-10-CM | POA: Diagnosis not present

## 2017-07-26 DIAGNOSIS — Z9861 Coronary angioplasty status: Secondary | ICD-10-CM | POA: Diagnosis not present

## 2017-07-26 DIAGNOSIS — J3489 Other specified disorders of nose and nasal sinuses: Secondary | ICD-10-CM | POA: Diagnosis not present

## 2017-07-27 DIAGNOSIS — C9110 Chronic lymphocytic leukemia of B-cell type not having achieved remission: Secondary | ICD-10-CM | POA: Diagnosis not present

## 2017-07-27 DIAGNOSIS — R070 Pain in throat: Secondary | ICD-10-CM | POA: Diagnosis not present

## 2017-07-27 DIAGNOSIS — D801 Nonfamilial hypogammaglobulinemia: Secondary | ICD-10-CM | POA: Diagnosis not present

## 2017-07-27 DIAGNOSIS — K219 Gastro-esophageal reflux disease without esophagitis: Secondary | ICD-10-CM | POA: Diagnosis not present

## 2017-07-27 DIAGNOSIS — R5383 Other fatigue: Secondary | ICD-10-CM | POA: Diagnosis not present

## 2017-07-27 DIAGNOSIS — R599 Enlarged lymph nodes, unspecified: Secondary | ICD-10-CM | POA: Diagnosis not present

## 2017-08-24 DIAGNOSIS — C9110 Chronic lymphocytic leukemia of B-cell type not having achieved remission: Secondary | ICD-10-CM | POA: Diagnosis not present

## 2017-08-29 DIAGNOSIS — C9110 Chronic lymphocytic leukemia of B-cell type not having achieved remission: Secondary | ICD-10-CM | POA: Diagnosis not present

## 2017-10-05 DIAGNOSIS — R972 Elevated prostate specific antigen [PSA]: Secondary | ICD-10-CM | POA: Diagnosis not present

## 2017-10-17 DIAGNOSIS — R972 Elevated prostate specific antigen [PSA]: Secondary | ICD-10-CM | POA: Diagnosis not present

## 2017-10-23 ENCOUNTER — Other Ambulatory Visit: Payer: Self-pay | Admitting: Interventional Cardiology

## 2017-11-24 DIAGNOSIS — R911 Solitary pulmonary nodule: Secondary | ICD-10-CM | POA: Diagnosis not present

## 2017-11-24 DIAGNOSIS — C9110 Chronic lymphocytic leukemia of B-cell type not having achieved remission: Secondary | ICD-10-CM | POA: Diagnosis not present

## 2017-11-24 DIAGNOSIS — K449 Diaphragmatic hernia without obstruction or gangrene: Secondary | ICD-10-CM | POA: Diagnosis not present

## 2017-11-30 DIAGNOSIS — C9110 Chronic lymphocytic leukemia of B-cell type not having achieved remission: Secondary | ICD-10-CM | POA: Diagnosis not present

## 2017-11-30 DIAGNOSIS — D801 Nonfamilial hypogammaglobulinemia: Secondary | ICD-10-CM | POA: Diagnosis not present

## 2017-11-30 DIAGNOSIS — R070 Pain in throat: Secondary | ICD-10-CM | POA: Diagnosis not present

## 2017-11-30 DIAGNOSIS — R599 Enlarged lymph nodes, unspecified: Secondary | ICD-10-CM | POA: Diagnosis not present

## 2017-11-30 DIAGNOSIS — K219 Gastro-esophageal reflux disease without esophagitis: Secondary | ICD-10-CM | POA: Diagnosis not present
